# Patient Record
Sex: Female | Born: 1955
Health system: Southern US, Community
[De-identification: ages and names within clinical notes are randomized; demographics above are authoritative.]

## PROBLEM LIST (undated history)

## (undated) DIAGNOSIS — J45909 Unspecified asthma, uncomplicated: Secondary | ICD-10-CM

## (undated) DIAGNOSIS — I2102 ST elevation (STEMI) myocardial infarction involving left anterior descending coronary artery: Secondary | ICD-10-CM

## (undated) DIAGNOSIS — I10 Essential (primary) hypertension: Secondary | ICD-10-CM

## (undated) DIAGNOSIS — E119 Type 2 diabetes mellitus without complications: Secondary | ICD-10-CM

## (undated) DIAGNOSIS — E785 Hyperlipidemia, unspecified: Secondary | ICD-10-CM

## (undated) DIAGNOSIS — I2510 Atherosclerotic heart disease of native coronary artery without angina pectoris: Secondary | ICD-10-CM

## (undated) HISTORY — DX: Atherosclerotic heart disease of native coronary artery without angina pectoris: I25.10

## (undated) HISTORY — PX: TUBAL LIGATION: SHX77

## (undated) HISTORY — DX: ST elevation (STEMI) myocardial infarction involving left anterior descending coronary artery: I21.02

## (undated) HISTORY — DX: Type 2 diabetes mellitus without complications: E11.9

## (undated) HISTORY — DX: Hyperlipidemia, unspecified: E78.5

## (undated) SURGERY — Surgical Case
Anesthesia: *Unknown

---

## 2005-01-06 ENCOUNTER — Ambulatory Visit: Payer: Self-pay | Admitting: Unknown Physician Specialty

## 2005-07-31 ENCOUNTER — Ambulatory Visit: Payer: Self-pay | Admitting: Internal Medicine

## 2005-12-21 ENCOUNTER — Ambulatory Visit: Payer: Self-pay | Admitting: Obstetrics and Gynecology

## 2007-12-30 ENCOUNTER — Ambulatory Visit: Payer: Self-pay | Admitting: Internal Medicine

## 2008-05-19 ENCOUNTER — Ambulatory Visit: Payer: Self-pay | Admitting: Internal Medicine

## 2008-09-08 ENCOUNTER — Ambulatory Visit: Payer: Self-pay | Admitting: Internal Medicine

## 2008-09-29 ENCOUNTER — Ambulatory Visit: Payer: Self-pay | Admitting: Obstetrics and Gynecology

## 2009-02-11 ENCOUNTER — Ambulatory Visit: Payer: Self-pay | Admitting: Family Medicine

## 2009-05-13 ENCOUNTER — Ambulatory Visit: Payer: Self-pay | Admitting: Obstetrics and Gynecology

## 2010-02-14 ENCOUNTER — Ambulatory Visit: Payer: Self-pay | Admitting: Internal Medicine

## 2010-02-24 ENCOUNTER — Ambulatory Visit: Payer: Self-pay | Admitting: Internal Medicine

## 2010-08-19 ENCOUNTER — Ambulatory Visit: Payer: Self-pay

## 2010-08-23 ENCOUNTER — Ambulatory Visit: Payer: Self-pay

## 2011-03-06 ENCOUNTER — Ambulatory Visit: Payer: Self-pay

## 2011-03-08 ENCOUNTER — Ambulatory Visit: Payer: Self-pay | Admitting: Internal Medicine

## 2011-07-10 ENCOUNTER — Ambulatory Visit: Payer: Self-pay

## 2011-12-25 ENCOUNTER — Ambulatory Visit: Payer: Self-pay

## 2011-12-25 LAB — COMPREHENSIVE METABOLIC PANEL
Anion Gap: 7 (ref 7–16)
Bilirubin,Total: 0.4 mg/dL (ref 0.2–1.0)
Chloride: 101 mmol/L (ref 98–107)
Co2: 31 mmol/L (ref 21–32)
Creatinine: 0.92 mg/dL (ref 0.60–1.30)
EGFR (African American): >60
EGFR (Non-African Amer.): >60
Osmolality: 279 (ref 275–301)
Potassium: 3.3 mmol/L — ABNORMAL LOW (ref 3.5–5.1)
SGOT(AST): 25 U/L (ref 15–37)
Sodium: 139 mmol/L (ref 136–145)

## 2011-12-25 LAB — CBC WITH DIFFERENTIAL/PLATELET
Basophil #: 0 10*3/uL (ref 0.0–0.1)
Basophil %: 0.5 %
Eosinophil #: 0.2 10*3/uL (ref 0.0–0.7)
Eosinophil %: 2.3 %
Lymphocyte %: 32.8 %
MCHC: 34 g/dL (ref 32.0–36.0)
Monocyte %: 4.8 %
Neutrophil #: 5.3 10*3/uL (ref 1.4–6.5)
Neutrophil %: 59.6 %
RBC: 4.6 10*6/uL (ref 3.80–5.20)
RDW: 13.2 % (ref 11.5–14.5)

## 2011-12-25 LAB — URINALYSIS, COMPLETE
Bacteria: NEGATIVE
Bilirubin,UR: NEGATIVE
Glucose,UR: NEGATIVE mg/dL (ref 0–75)
Leukocyte Esterase: NEGATIVE
Nitrite: NEGATIVE
Protein: NEGATIVE
Specific Gravity: 1.015 (ref 1.003–1.030)

## 2011-12-26 ENCOUNTER — Ambulatory Visit: Payer: Self-pay | Admitting: Internal Medicine

## 2012-03-15 ENCOUNTER — Ambulatory Visit: Payer: Self-pay

## 2012-03-15 LAB — CBC WITH DIFFERENTIAL/PLATELET
Basophil %: 0.9 %
Eosinophil %: 3 %
HCT: 42.8 % (ref 35.0–47.0)
HGB: 14 g/dL (ref 12.0–16.0)
Lymphocyte #: 3 10*3/uL (ref 1.0–3.6)
MCV: 93 fL (ref 80–100)
Monocyte #: 0.4 x10 3/mm (ref 0.2–0.9)
Monocyte %: 4.5 %
Neutrophil #: 5.1 10*3/uL (ref 1.4–6.5)
RBC: 4.6 10*6/uL (ref 3.80–5.20)
WBC: 8.8 10*3/uL (ref 3.6–11.0)

## 2012-03-15 LAB — LIPID PANEL
Cholesterol: 145 mg/dL (ref 0–200)
HDL Cholesterol: 23 mg/dL — ABNORMAL LOW (ref 40–60)
Ldl Cholesterol, Calc: 80 mg/dL (ref 0–100)
Triglycerides: 209 mg/dL — ABNORMAL HIGH (ref 0–200)
VLDL Cholesterol, Calc: 42 mg/dL — ABNORMAL HIGH (ref 5–40)

## 2012-03-15 LAB — COMPREHENSIVE METABOLIC PANEL
Albumin: 3.8 g/dL (ref 3.4–5.0)
Alkaline Phosphatase: 122 U/L (ref 50–136)
BUN: 14 mg/dL (ref 7–18)
Bilirubin,Total: 0.4 mg/dL (ref 0.2–1.0)
Creatinine: 0.89 mg/dL (ref 0.60–1.30)
Glucose: 96 mg/dL (ref 65–99)
SGPT (ALT): 39 U/L (ref 12–78)
Sodium: 143 mmol/L (ref 136–145)
Total Protein: 7.8 g/dL (ref 6.4–8.2)

## 2012-05-08 ENCOUNTER — Ambulatory Visit: Payer: Self-pay | Admitting: Family Medicine

## 2012-05-08 LAB — CBC WITH DIFFERENTIAL/PLATELET
Basophil %: 1.2 %
HGB: 13.3 g/dL (ref 12.0–16.0)
Lymphocyte #: 2.1 10*3/uL (ref 1.0–3.6)
Lymphocyte %: 25.5 %
MCH: 30.3 pg (ref 26.0–34.0)
MCV: 92 fL (ref 80–100)
Monocyte %: 7.3 %
Neutrophil #: 5.2 10*3/uL (ref 1.4–6.5)
RBC: 4.39 10*6/uL (ref 3.80–5.20)
RDW: 13.5 % (ref 11.5–14.5)

## 2012-05-08 LAB — RAPID INFLUENZA A&B ANTIGENS

## 2012-12-16 ENCOUNTER — Emergency Department: Payer: Self-pay | Admitting: Emergency Medicine

## 2012-12-16 LAB — CBC
HCT: 42.9 % (ref 35.0–47.0)
HGB: 14.7 g/dL (ref 12.0–16.0)
MCH: 31.2 pg (ref 26.0–34.0)
MCV: 91 fL (ref 80–100)
Platelet: 274 10*3/uL (ref 150–440)
RBC: 4.7 10*6/uL (ref 3.80–5.20)
RDW: 13.6 % (ref 11.5–14.5)
WBC: 8 10*3/uL (ref 3.6–11.0)

## 2012-12-16 LAB — BASIC METABOLIC PANEL
BUN: 12 mg/dL (ref 7–18)
Calcium, Total: 9.1 mg/dL (ref 8.5–10.1)
Chloride: 105 mmol/L (ref 98–107)
EGFR (African American): >60
Glucose: 146 mg/dL — ABNORMAL HIGH (ref 65–99)
Osmolality: 280 (ref 275–301)
Sodium: 139 mmol/L (ref 136–145)

## 2012-12-16 LAB — TROPONIN I
Troponin-I: <0.02 ng/mL
Troponin-I: <0.02 ng/mL

## 2013-03-14 ENCOUNTER — Ambulatory Visit: Payer: Self-pay | Admitting: Internal Medicine

## 2013-08-09 ENCOUNTER — Ambulatory Visit: Payer: Self-pay | Admitting: Family Medicine

## 2014-01-28 ENCOUNTER — Ambulatory Visit: Payer: Self-pay

## 2014-02-19 ENCOUNTER — Ambulatory Visit: Payer: Self-pay

## 2014-05-18 ENCOUNTER — Ambulatory Visit: Payer: Self-pay | Admitting: Physician Assistant

## 2014-05-18 LAB — RAPID STREP-A WITH REFLX: MICRO TEXT REPORT: NEGATIVE

## 2014-05-18 LAB — RAPID INFLUENZA A&B ANTIGENS

## 2014-05-20 LAB — BETA STREP CULTURE(ARMC)

## 2014-12-11 ENCOUNTER — Ambulatory Visit: Payer: Managed Care, Other (non HMO)

## 2014-12-11 ENCOUNTER — Ambulatory Visit
Admission: EM | Admit: 2014-12-11 | Discharge: 2014-12-11 | Disposition: A | Discharge status: Home / Self Care | Payer: Managed Care, Other (non HMO) | Attending: Family Medicine | Admitting: Family Medicine

## 2014-12-11 ENCOUNTER — Encounter: Payer: Self-pay | Admitting: Emergency Medicine

## 2014-12-11 DIAGNOSIS — S93402A Sprain of unspecified ligament of left ankle, initial encounter: Secondary | ICD-10-CM | POA: Diagnosis not present

## 2014-12-11 DIAGNOSIS — M7732 Calcaneal spur, left foot: Secondary | ICD-10-CM | POA: Diagnosis not present

## 2014-12-11 DIAGNOSIS — S92302A Fracture of unspecified metatarsal bone(s), left foot, initial encounter for closed fracture: Secondary | ICD-10-CM

## 2014-12-11 HISTORY — DX: Unspecified asthma, uncomplicated: J45.909

## 2014-12-11 HISTORY — DX: Essential (primary) hypertension: I10

## 2014-12-11 MED ORDER — IBUPROFEN 800 MG PO TABS: 800.0000 mg | ORAL_TABLET | Freq: Three times a day (TID) | ORAL | Status: DC | PRN

## 2014-12-11 NOTE — Discharge Instructions (Signed)
Acute Ankle Sprain with Phase I Rehab An acute ankle sprain is a partial or complete tear in one or more of the ligaments of the ankle due to traumatic injury. The severity of the injury depends on both the number of ligaments sprained and the grade of sprain. There are 3 grades of sprains.   A grade 1 sprain is a mild sprain. There is a slight pull without obvious tearing. There is no loss of strength, and the muscle and ligament are the correct length.  A grade 2 sprain is a moderate sprain. There is tearing of fibers within the substance of the ligament where it connects two bones or two cartilages. The length of the ligament is increased, and there is usually decreased strength.  A grade 3 sprain is a complete rupture of the ligament and is uncommon. In addition to the grade of sprain, there are three types of ankle sprains.  Lateral ankle sprains: This is a sprain of one or more of the three ligaments on the outer side (lateral) of the ankle. These are the most common sprains. Medial ankle sprains: There is one large triangular ligament of the inner side (medial) of the ankle that is susceptible to injury. Medial ankle sprains are less common. Syndesmosis, "high ankle," sprains: The syndesmosis is the ligament that connects the two bones of the lower leg. Syndesmosis sprains usually only occur with very severe ankle sprains. SYMPTOMS  Pain, tenderness, and swelling in the ankle, starting at the side of injury that may progress to the whole ankle and foot with time.  "Pop" or tearing sensation at the time of injury.  Bruising that may spread to the heel.  Impaired ability to walk soon after injury. CAUSES   Acute ankle sprains are caused by trauma placed on the ankle that temporarily forces or pries the anklebone (talus) out of its normal socket.  Stretching or tearing of the ligaments that normally hold the joint in place (usually due to a twisting injury). RISK INCREASES  WITH:  Previous ankle sprain.  Sports in which the foot may land awkwardly (i.e., basketball, volleyball, or soccer) or walking or running on uneven or rough surfaces.  Shoes with inadequate support to prevent sideways motion when stress occurs.  Poor strength and flexibility.  Poor balance skills.  Contact sports. PREVENTION   Warm up and stretch properly before activity.  Maintain physical fitness:  Ankle and leg flexibility, muscle strength, and endurance.  Cardiovascular fitness.  Balance training activities.  Use proper technique and have a coach correct improper technique.  Taping, protective strapping, bracing, or high-top tennis shoes may help prevent injury. Initially, tape is best; however, it loses most of its support function within 10 to 15 minutes.  Wear proper-fitted protective shoes (High-top shoes with taping or bracing is more effective than either alone).  Provide the ankle with support during sports and practice activities for 12 months following injury. PROGNOSIS   If treated properly, ankle sprains can be expected to recover completely; however, the length of recovery depends on the degree of injury.  A grade 1 sprain usually heals enough in 5 to 7 days to allow modified activity and requires an average of 6 weeks to heal completely.  A grade 2 sprain requires 6 to 10 weeks to heal completely.  A grade 3 sprain requires 12 to 16 weeks to heal.  A syndesmosis sprain often takes more than 3 months to heal. RELATED COMPLICATIONS   Frequent recurrence of symptoms may  result in a chronic problem. Appropriately addressing the problem the first time decreases the frequency of recurrence and optimizes healing time. Severity of the initial sprain does not predict the likelihood of later instability.  Injury to other structures (bone, cartilage, or tendon).  A chronically unstable or arthritic ankle joint is a possibility with repeated  sprains. TREATMENT Treatment initially involves the use of ice, medication, and compression bandages to help reduce pain and inflammation. Ankle sprains are usually immobilized in a walking cast or boot to allow for healing. Crutches may be recommended to reduce pressure on the injury. After immobilization, strengthening and stretching exercises may be necessary to regain strength and a full range of motion. Surgery is rarely needed to treat ankle sprains. MEDICATION   Nonsteroidal anti-inflammatory medications, such as aspirin and ibuprofen (do not take for the first 3 days after injury or within 7 days before surgery), or other minor pain relievers, such as acetaminophen, are often recommended. Take these as directed by your caregiver. Contact your caregiver immediately if any bleeding, stomach upset, or signs of an allergic reaction occur from these medications.  Ointments applied to the skin may be helpful.  Pain relievers may be prescribed as necessary by your caregiver. Do not take prescription pain medication for longer than 4 to 7 days. Use only as directed and only as much as you need. HEAT AND COLD  Cold treatment (icing) is used to relieve pain and reduce inflammation for acute and chronic cases. Cold should be applied for 10 to 15 minutes every 2 to 3 hours for inflammation and pain and immediately after any activity that aggravates your symptoms. Use ice packs or an ice massage.  Heat treatment may be used before performing stretching and strengthening activities prescribed by your caregiver. Use a heat pack or a warm soak. SEEK IMMEDIATE MEDICAL CARE IF:   Pain, swelling, or bruising worsens despite treatment.  You experience pain, numbness, discoloration, or coldness in the foot or toes.  New, unexplained symptoms develop (drugs used in treatment may produce side effects.) EXERCISES  PHASE I EXERCISES RANGE OF MOTION (ROM) AND STRETCHING EXERCISES - Ankle Sprain, Acute Phase I,  Weeks 1 to 2 These exercises may help you when beginning to restore flexibility in your ankle. You will likely work on these exercises for the 1 to 2 weeks after your injury. Once your physician, physical therapist, or athletic trainer sees adequate progress, he or she will advance your exercises. While completing these exercises, remember:   Restoring tissue flexibility helps normal motion to return to the joints. This allows healthier, less painful movement and activity.  An effective stretch should be held for at least 30 seconds.  A stretch should never be painful. You should only feel a gentle lengthening or release in the stretched tissue. RANGE OF MOTION - Dorsi/Plantar Flexion  While sitting with your right / left knee straight, draw the top of your foot upwards by flexing your ankle. Then reverse the motion, pointing your toes downward.  Hold each position for __________ seconds.  After completing your first set of exercises, repeat this exercise with your knee bent. Repeat __________ times. Complete this exercise __________ times per day.  RANGE OF MOTION - Ankle Alphabet  Imagine your right / left big toe is a pen.  Keeping your hip and knee still, write out the entire alphabet with your "pen." Make the letters as large as you can without increasing any discomfort. Repeat __________ times. Complete this exercise __________  times per day.  STRENGTHENING EXERCISES - Ankle Sprain, Acute -Phase I, Weeks 1 to 2 These exercises may help you when beginning to restore strength in your ankle. You will likely work on these exercises for 1 to 2 weeks after your injury. Once your physician, physical therapist, or athletic trainer sees adequate progress, he or she will advance your exercises. While completing these exercises, remember:   Muscles can gain both the endurance and the strength needed for everyday activities through controlled exercises.  Complete these exercises as instructed by  your physician, physical therapist, or athletic trainer. Progress the resistance and repetitions only as guided.  You may experience muscle soreness or fatigue, but the pain or discomfort you are trying to eliminate should never worsen during these exercises. If this pain does worsen, stop and make certain you are following the directions exactly. If the pain is still present after adjustments, discontinue the exercise until you can discuss the trouble with your clinician. STRENGTH - Dorsiflexors  Secure a rubber exercise band/tubing to a fixed object (i.e., table, pole) and loop the other end around your right / left foot.  Sit on the floor facing the fixed object. The band/tubing should be slightly tense when your foot is relaxed.  Slowly draw your foot back toward you using your ankle and toes.  Hold this position for __________ seconds. Slowly release the tension in the band and return your foot to the starting position. Repeat __________ times. Complete this exercise __________ times per day.  STRENGTH - Plantar-flexors   Sit with your right / left leg extended. Holding onto both ends of a rubber exercise band/tubing, loop it around the ball of your foot. Keep a slight tension in the band.  Slowly push your toes away from you, pointing them downward.  Hold this position for __________ seconds. Return slowly, controlling the tension in the band/tubing. Repeat __________ times. Complete this exercise __________ times per day.  STRENGTH - Ankle Eversion  Secure one end of a rubber exercise band/tubing to a fixed object (table, pole). Loop the other end around your foot just before your toes.  Place your fists between your knees. This will focus your strengthening at your ankle.  Drawing the band/tubing across your opposite foot, slowly, pull your little toe out and up. Make sure the band/tubing is positioned to resist the entire motion.  Hold this position for __________ seconds. Have  your muscles resist the band/tubing as it slowly pulls your foot back to the starting position.  Repeat __________ times. Complete this exercise __________ times per day.  STRENGTH - Ankle Inversion  Secure one end of a rubber exercise band/tubing to a fixed object (table, pole). Loop the other end around your foot just before your toes.  Place your fists between your knees. This will focus your strengthening at your ankle.  Slowly, pull your big toe up and in, making sure the band/tubing is positioned to resist the entire motion.  Hold this position for __________ seconds.  Have your muscles resist the band/tubing as it slowly pulls your foot back to the starting position. Repeat __________ times. Complete this exercises __________ times per day.  STRENGTH - Towel Curls  Sit in a chair positioned on a non-carpeted surface.  Place your right / left foot on a towel, keeping your heel on the floor.  Pull the towel toward your heel by only curling your toes. Keep your heel on the floor.  If instructed by your physician, physical therapist,   or athletic trainer, add weight to the end of the towel. Repeat __________ times. Complete this exercise __________ times per day. Document Released: 11/02/2004 Document Revised: 08/18/2013 Document Reviewed: 07/16/2008 [facility] Patient Information 2015 [employer], [place]. This information is not intended to replace advice given to you by your health care provider. Make sure you discuss any questions you have with your health care provider. Toe Fracture  with Rehab A fracture is a break in the bone that can be either partial or complete. Fractures of the toe bones may or may not include the joints that separate the bones. SYMPTOMS   Severe pain over the fracture site at the time of injury that may persist for an extend period of time.  Pain, tenderness, inflammation, and/or bruising (contusion) over the fracture site.  Visible deformity, if the bone  fragments are not properly aligned (displaced fracture).  Signs of vascular damage: numbness or coldness (uncommon). CAUSES  Toe fractures occur when a force is placed on the bone that is greater than it can withstand.  Direct hit (trauma) to the toe.  Indirect trauma to the toe, such as forcefully pivoting on a planted foot. RISK INCREASES WITH:  Performing activities barefoot (i.e. ballet, gymnastics).  Wearing shoes with little support or protection.  Sports with cleats (i.e. football, rugby, lacrosse, soccer).  Bone disease (i.e. osteoporosis, bone tumors). PREVENTION   Wear properly fitted and protective shoes.  Protect previously injured toes with tape or padding. PROGNOSIS  If treated properly, toe fractures usually heal within 4 to 6 weeks. RELATED COMPLICATIONS   Failure of the fracture to heal (nonunion).  Healing of the fracture in a poor position (malunion).  Recurring symptoms.  Recurring symptoms that result in a chronic problem.  Excessive bleeding, causing pressure on nerves and blood vessels (rare).  Arthritis of the affected joints.  Stopping of bone growth in children.  Infection in fractures where the skin is broken over the fracture (open fracture).  Shortening of injured bones. TREATMENT  Treatment first involves the use of ice and medicine to reduce pain and inflammation. The toe should be restrained for a period of time to allow for healing, usually about 4 weeks. Your caregiver may advise wearing a hard-soled shoe to minimize stress on the healing bone. Surgery is uncommon for this injury, but may be necessary if the fracture is severely displaced or if the bone pushes through the skin. Surgery typically involves the use of screws, pins, and/or plates to hold the fracture in place. After surgery, restraint of the foot is necessary. MEDICATION   If pain medicine is necessary, nonsteroidal anti-inflammatory medications (aspirin and ibuprofen), or  other minor pain relievers (acetaminophen), are often recommended.  Do not take pain medicine for 7 days before surgery.  Prescription pain relievers may be given if your caregiver thinks they are needed. Use only as directed and only as much as you need. COLD THERAPY  Cold treatment (icing) relieves pain and reduces inflammation. Cold treatment should be applied for 10 to 15 minutes every 2 to 3 hours, and immediately after activity that aggravates your symptoms. Use ice packs or an ice massage. SEEK MEDICAL CARE IF:   Treatment does not seem to help, or the condition gets worse.  Any medicines produce negative side effects.  Any complications from surgery occur:  Pain, numbness, or coldness in the affected foot.  Discoloration beneath the toenails (blue or gray) of the affected foot.  Signs of infection (fever, pain, inflammation, redness, or persistent  bleeding). EXERCISES RANGE OF MOTION (ROM) AND STRETCHING EXERCISES - Toe Fracture (Phalangeal) These exercises may help you when beginning to rehabilitate your injury. Your symptoms may resolve with or without further involvement from your physician, physical therapist or athletic trainer. While completing these exercises, remember:   Restoring tissue flexibility helps normal motion to return to the joints. This allows healthier, less painful movement and activity.  An effective stretch should be held for at least 30 seconds.  A stretch should never be painful. You should only feel a gentle lengthening or release in the stretched tissue. RANGE OF MOTION - Dorsi/Plantar Flexion  While sitting with your right / left knee straight, draw the top of your foot upwards by flexing your ankle. Then reverse the motion, pointing your toes downward.  Hold each position for __________ seconds.  After completing your first set of exercises, repeat this exercise with your knee bent. Repeat __________ times. Complete this exercise __________  times per day.  RANGE OF MOTION - Ankle Alphabet Imagine your right / left big toe is a pen. Keeping your hip and knee still, write out the entire alphabet with your "pen." Make the letters as large as you can without increasing any discomfort. Repeat __________ times. Complete this exercise __________ times per day.  RANGE OF MOTION - Toe Extension, Flexion  Sit with your right / left leg crossed over your opposite knee.  Grasp your toes and gently pull them back toward the top of your foot. You should feel a stretch on the bottom of your toes and foot.  Hold this stretch for __________ seconds.  Now, gently pull your toes toward the bottom of your foot. You should feel a stretch on the top of your toes and foot.  Hold this stretch for __________ seconds. Repeat __________ times. Complete this stretch__________ times per day.  STRENGTHENING EXERCISES - Toe Fracture (Phalangeal) These exercises may help you when beginning to rehabilitate your injury. They may resolve your symptoms with or without further involvement from your physician, physical therapist or athletic trainer. While completing these exercises, remember:   Muscles can gain both the endurance and the strength needed for everyday activities through controlled exercises.  Complete these exercises as instructed by your physician, physical therapist or athletic trainer. Increase the resistance and repetitions only as guided.  You may experience muscle soreness or fatigue, but the pain or discomfort you are trying to eliminate should never worsen during these exercises. If this pain does get worse, stop and make sure you are following the directions exactly. If the pain is still present after adjustments, discontinue the exercise until you can discuss the trouble with your clinician. STRENGTH - Towel Curls  Sit in a chair, on a non-carpeted surface.  Place your foot on a towel, keeping your heel on the floor.  Pull the towel  toward your heel only by curling your toes. Keep your heel on the floor.  If instructed by your physician, physical therapist or athletic trainer, add ____________________ at the end of the towel. Repeat __________ times. Complete this exercise __________ times per day. Document Released: 04/03/2005 Document Revised: 06/26/2011 Document Reviewed: 07/16/2008 [facility] Patient Information 2015 [place], [place]. This information is not intended to replace advice given to you by your health care provider. Make sure you discuss any questions you have with your health care provider. Heel Spur A heel spur is a hook of bone that can form on the calcaneus (the heel bone and the largest bone of  the foot). Heel spurs are often associated with plantar fasciitis and usually come in people who have had the problem for an extended period of time. The cause of the relationship is unknown. The pain associated with them is thought to be caused by an inflammation (soreness and redness) of the plantar fascia rather than the spur itself. The plantar fascia is a thick fibrous like tissue that runs from the calcaneus (heel bone) to the ball of the foot. This strong, tight tissue helps maintain the arch of your foot. It helps distribute the weight across your foot as you walk or run. Stresses placed on the plantar fascia can be tremendous. When it is inflamed normal activities become painful. Pain is worse in the morning after sleeping. After sleeping the plantar fascia is tight. The first movements stretch the fascia and this causes pain. As the tendon loosens, the pain usually gets better. It often returns with too much standing or walking.  About 70% of patients with plantar fasciitis have a heel spur. About half of people without foot pain also have heel spurs. DIAGNOSIS  The diagnosis of a heel spur is made by X-ray. The X-ray shows a hook of bone protruding from the bottom of the calcaneus at the point where the plantar  fascia is attached to the heel bone.  TREATMENT  It is necessary to find out what is causing the stretching of the plantar fascia. If the cause is over-pronation (flat feet), orthotics and proper foot ware may help.  Stretching exercises, losing weight, wearing shoes that have a cushioned heel that absorbs shock, and elevating the heel with the use of a heel cradle, heel cup, or orthotics may all help. Heel cradles and heel cups provide extra comfort and cushion to the heel, and reduce the amount of shock to the sore area. AVOIDING THE PAIN OF PLANTAR FASCIITIS AND HEEL SPURS  Consult a sports medicine professional before beginning a new exercise program.  Walking programs offer a good workout. There is a lower chance of overuse injuries common to the runners. There is less impact and less jarring of the joints.  Begin all new exercise programs slowly. If problems or pains develop, decrease the amount of time or distance until you are at a comfortable level.  Wear good shoes and replace them regularly.  Stretch your foot and the heel cords at the back of the ankle (Achilles tendons) both before and after exercise.  Run or exercise on even surfaces that are not hard. For example, asphalt is better than pavement.  Do not run barefoot on hard surfaces.  If using a treadmill, vary the incline.  Do not continue to workout if you have foot or joint problems. Seek professional help if they do not improve. HOME CARE INSTRUCTIONS   Avoid activities that cause you pain until you recover.  Use ice or cold packs to the problem or painful areas after working out.  Only take over-the-counter or prescription medicines for pain, discomfort, or fever as directed by your caregiver.  Soft shoe inserts or athletic shoes with air or gel sole cushions may be helpful.  If problems continue or become more severe, consult a sports medicine caregiver. Cortisone is a potent anti-inflammatory medication that  may be injected into the painful area. You can discuss this treatment with your caregiver. MAKE SURE YOU:   Understand these instructions.  Will watch your condition.  Will get help right away if you are not doing well or get  worse. Document Released: 05/10/2005 Document Revised: 06/26/2011 Document Reviewed: 06/04/2013 [facility] Patient Information 2015 [place], [place]. This information is not intended to replace advice given to you by your health care provider. Make sure you discuss any questions you have with your health care provider.

## 2014-12-11 NOTE — ED Provider Notes (Signed)
CSN: [id number]     Arrival date & time 12/11/14  1030 History   First MD Initiated Contact with Patient 12/11/14 1107     Chief Complaint  Patient presents with  . Foot Injury   (Consider location/radiation/quality/duration/timing/severity/associated sxs/prior Treatment) HPI Comments: Married caucasian female here for evaluation of left foot and ankle pain.  Left ankle rolled (Everted) in [place] last night stepping off stairs thinks she stepped on rock.  Has been sore, swollen and motrin not helping.  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  Cannot tolerate any closed shoes on left foot today wearing flip flops.  Pain with ambulation.  Took motrin 600mg  po this am helps a little.  Iced last night and this am.  Not scheduled to work today doesn't need work excuse.  The history is provided by the patient.    Past Medical History  Diagnosis Date  . Asthma   . Hypertension    Past Surgical History  Procedure Laterality Date  . Tubal ligation     History reviewed. No pertinent family history. Social History  Substance Use Topics  . Smoking status: Never Smoker   . Smokeless tobacco: None  . Alcohol Use: No   OB History    No data available     Review of Systems  Constitutional: Negative for fever, chills, diaphoresis, activity change, appetite change and fatigue.  HENT: Negative for congestion, dental problem, drooling, ear discharge, ear pain, facial swelling, hearing loss, mouth sores and nosebleeds.   Eyes: Negative for photophobia, pain, discharge, redness and itching.  Respiratory: Negative for cough, shortness of breath, wheezing and stridor.   Cardiovascular: Negative for chest pain, palpitations and leg swelling.  Gastrointestinal: Negative for nausea, vomiting, abdominal pain, diarrhea, constipation, blood in stool and abdominal distention.  Endocrine: Negative for cold intolerance and heat intolerance.    Genitourinary: Negative for dysuria and difficulty urinating.  Musculoskeletal: Positive for myalgias, joint swelling and gait problem. Negative for back pain, neck pain and neck stiffness.  Skin: Negative for color change, pallor, rash and wound.  Allergic/Immunologic: Positive for environmental allergies. Negative for food allergies.  Neurological: Negative for dizziness, tremors, seizures, facial asymmetry, weakness, light-headedness, numbness and headaches.  Hematological: Negative for adenopathy. Does not bruise/bleed easily.  Psychiatric/Behavioral: Negative for behavioral problems, confusion, sleep disturbance and agitation.    Allergies  Review of patient's allergies indicates no known allergies.  Home Medications   Prior to Admission medications   Medication Sig Start Date End Date Taking? Authorizing Provider  fluticasone (FLONASE) 50 MCG/ACT nasal spray Place 1 spray into both nostrils daily.   Yes Historical Provider, MD  lisinopril (PRINIVIL,ZESTRIL) 20 MG tablet Take 20 mg by mouth daily.   Yes Historical Provider, MD  ibuprofen (ADVIL,MOTRIN) 800 MG tablet Take 1 tablet (800 mg total) by mouth every 8 (eight) hours as needed. 12/11/14   Olen Cordial, NP   Meds Ordered and Administered this Visit  Medications - No data to display  BP 127/70 mmHg  Pulse 79  Temp(Src) 98.5 F (36.9 C) (Tympanic)  Resp 18  Ht 5\' 3"  (1.6 m)  Wt 175 lb (79.379 kg)  BMI 31.01 kg/m2  SpO2 99%  LMP  No data found.   Physical Exam  Constitutional: She is oriented to person, place, and time. Vital signs are normal. She appears well-developed and well-nourished. No distress.  HENT:  Head: Normocephalic and atraumatic.  Right Ear: External ear normal.  Left Ear: External ear normal.  Nose: Nose normal.  Mouth/Throat: Oropharynx is clear and moist. No oropharyngeal exudate.  Eyes: Conjunctivae, EOM and lids are normal. Pupils are equal, round, and reactive to light. Right eye  exhibits no discharge. Left eye exhibits no discharge. No scleral icterus.  Neck: Trachea normal and normal range of motion. Neck supple. No tracheal deviation present.  Cardiovascular: Normal rate, regular rhythm and intact distal pulses.   Pulmonary/Chest: Effort normal and breath sounds normal. No stridor. No respiratory distress. She has no wheezes. She has no rales.  Abdominal: Soft. She exhibits no distension.  Musculoskeletal: She exhibits edema and tenderness.       Right hip: Normal.       Left hip: Normal.       Right knee: Normal.       Left knee: Normal.       Right ankle: Normal.       Left ankle: She exhibits decreased range of motion and swelling. She exhibits no ecchymosis, no deformity, no laceration and normal pulse. Tenderness. Lateral malleolus and head of 5th metatarsal tenderness found. No medial malleolus and no proximal fibula tenderness found. Achilles tendon normal. Achilles tendon exhibits no pain and no defect.       Right upper leg: Normal.       Left upper leg: Normal.       Right lower leg: Normal.       Left lower leg: Normal.       Legs:      Right foot: Normal.       Left foot: There is tenderness, bony tenderness and swelling. There is normal range of motion, normal capillary refill, no crepitus, no deformity and no laceration.  Neurological: She is alert and oriented to person, place, and time. She exhibits normal muscle tone. Coordination normal.  Skin: Skin is warm, dry and intact. No abrasion, no bruising, no burn, no ecchymosis, no laceration, no lesion, no petechiae, no purpura and no rash noted. Rash is not macular, not papular, not maculopapular, not nodular, not pustular, not vesicular and not urticarial. She is not diaphoretic. No cyanosis or erythema. No pallor. Nails show no clubbing.  Psychiatric: She has a normal mood and affect. Her speech is normal and behavior is normal. Judgment and thought content normal. Cognition and memory are normal.    Nursing note and vitals reviewed.   ED Course  Procedures (including critical care time)  Labs Review Labs Reviewed - No data to display  Imaging Review Dg Ankle Complete Left  12/11/2014   CLINICAL DATA:  Acute left foot pain following twisting injury last night. Initial encounter.  EXAM: LEFT ANKLE COMPLETE - 3+ VIEW  COMPARISON:  None.  FINDINGS: A nondisplaced transverse fracture at the base of the fifth metatarsal is noted.  No other fracture, subluxation or dislocation identified.  The ankle joint is unremarkable.  No focal bony lesions are present.  IMPRESSION: Nondisplaced transverse fracture of the fifth metatarsal base.   Electronically Signed   By: Margarette Canada M.D.   On: 12/11/2014 12:26   Dg Foot Complete Left  12/11/2014   CLINICAL DATA:  Twisting injury last night. Lateral pain. Initial encounter.  EXAM: LEFT FOOT - COMPLETE 3+ VIEW  COMPARISON:  Ankle films, dictated separately.  FINDINGS: Tiny calcaneal spur. Nondisplaced fracture the base of fifth metatarsal. Mild midfoot soft tissue swelling laterally.  IMPRESSION: Fracture at the base of the fifth metatarsal.   Electronically Signed  By: Abigail Miyamoto M.D.   On: 12/11/2014 12:26   1250 patient notified of xray results and given copy of radiology report and received disk with images.  Patient prefers to see Dr Jefm Bryant and will call to schedule appt with him.  Patient verbalized understanding of information/instructions, agreed with plan of care and had no further questions at this time.  MDM   1. Fracture of 5th metatarsal, left, closed, initial encounter   2. Calcaneal spur of foot, left   3. Ankle sprain, left, initial encounter    Plan: 1. Test/x-ray results and diagnosis reviewed with patient 2. rx as per orders; risks, benefits, potential side effects reviewed with patient 3. Recommend supportive treatment with cam walker, crutches, motrin, ice and elevation 4. F/u prn if symptoms worsen or don't  improve  Patient was instructed to rest, ice and elevate the ankle as much as possible.  Activity as tolerated and work on ROM exercises per [employer] handout ankle sprain with phase I rehab.  Patient is to take NSAIDS as needed motrin 800mg  po TID.  Discussed at risk to reinjure ankle over the next year and to wear supportive footwear/ankle sleeve/ace bandage.  Follow up with PCM if symptoms persist greater than 4 weeks for re-evaluation as ankle sprains typically heal over 4 weeks.  Work restriction note given to patient for crutches, ice, elevation when sitting.  Patient was placed in cam walker for 5th metatarsal fracture avoid weightbearing/impact to left foot/use crutches.  C[place]oot and crutches fitted and distributed by [employer].  Exitcare handout on toe fractures given to patient.  Ensure getting adequate calcium and vitamin D in diet.  Patient verbalized agreement and understanding of treatment plan and had no further questions at this time.   P2:  Injury Prevention and Fitness.  New calcaneal spur on xray discussed if heel pain starts to ice heel/stretches, avoid flip flops wear supportive shoes, avoid impact activities and if walking treadmill/grass/soft path best.  Use cushioned mat if having to stand in one place for long durations.  Patient given [facility] handout on heel spur/plantar fasciitis.  Discussed stretches and icing.  Follow up with PCM if no improvement with discussed care for podiatry referral.  May use motrin 800mg  po TID if pain.  Consider new supportive footwear/OTC inserts if shoe treads worn out/greater than 67 year old.  Patient verbalized understanding of instructions, agreed with plan of care and had no further questions at this time.     Olen Cordial, NP 12/11/14 1346

## 2014-12-11 NOTE — ED Notes (Signed)
Pt with left foot injury x last pm pain and swelling in left foot ice applied

## 2014-12-12 ENCOUNTER — Ambulatory Visit
Admission: EM | Admit: 2014-12-12 | Discharge: 2014-12-12 | Disposition: A | Discharge status: Home / Self Care | Payer: Managed Care, Other (non HMO) | Attending: Family Medicine | Admitting: Family Medicine

## 2014-12-12 ENCOUNTER — Encounter: Payer: Self-pay | Admitting: *Deleted

## 2014-12-12 DIAGNOSIS — R600 Localized edema: Secondary | ICD-10-CM | POA: Diagnosis not present

## 2014-12-12 DIAGNOSIS — S92302S Fracture of unspecified metatarsal bone(s), left foot, sequela: Secondary | ICD-10-CM | POA: Diagnosis not present

## 2014-12-12 NOTE — ED Provider Notes (Signed)
CSN: [id number]     Arrival date & time 12/12/14  1158 History   First MD Initiated Contact with Patient 12/12/14 1329     Chief Complaint  Patient presents with  . Leg Swelling   (Consider location/radiation/quality/duration/timing/severity/associated sxs/prior Treatment) HPI Comments: 59 yo female seen yesterday with a foot/ankle injury and diagnosed with a metatarsal fracture of the base of the 5th metatarsal. Here with concern for left shin swelling. Denies any pain or redness. Patient was placed on CAM boot for immobilization and states she kept her leg elevated yesterday but today woke up with more swelling above the ankle joint. Denies pain to this area. No numbness or tingling.  No recent history of prolonged immobilization or surgery.  The history is provided by the patient.    Past Medical History  Diagnosis Date  . Asthma   . Hypertension    Past Surgical History  Procedure Laterality Date  . Tubal ligation     History reviewed. No pertinent family history. Social History  Substance Use Topics  . Smoking status: Never Smoker   . Smokeless tobacco: Never Used  . Alcohol Use: None   OB History    No data available     Review of Systems  Allergies  Review of patient's allergies indicates no known allergies.  Home Medications   Prior to Admission medications   Medication Sig Start Date End Date Taking? Authorizing Provider  fluticasone (FLONASE) 50 MCG/ACT nasal spray Place 1 spray into both nostrils daily.    Historical Provider, MD  ibuprofen (ADVIL,MOTRIN) 800 MG tablet Take 1 tablet (800 mg total) by mouth every 8 (eight) hours as needed. 12/11/14   Olen Cordial, NP  lisinopril (PRINIVIL,ZESTRIL) 20 MG tablet Take 20 mg by mouth daily.    Historical Provider, MD   Meds Ordered and Administered this Visit  Medications - No data to display  BP 135/72 mmHg  Pulse 72  Temp(Src) 98.5 F (36.9 C) (Oral)  Resp 18  Ht 5\' 2"  (1.575 m)  Wt 175 lb (79.379  kg)  BMI 32.00 kg/m2  SpO2 97% No data found.   Physical Exam  Constitutional: She appears well-developed and well-nourished. No distress.  Musculoskeletal: She exhibits edema.  Left lower extremity neurovascularly intact; tenderness to palpation at base of 5th metatarsal; no calf tenderness or erythema; mild edema noted to the anterior/lateral lower shin; skin intact  Neurological: She is alert.  Skin: She is not diaphoretic.  Nursing note and vitals reviewed.   ED Course  Procedures (including critical care time)  Labs Review Labs Reviewed - No data to display  Imaging Review Dg Ankle Complete Left  12/11/2014   CLINICAL DATA:  Acute left foot pain following twisting injury last night. Initial encounter.  EXAM: LEFT ANKLE COMPLETE - 3+ VIEW  COMPARISON:  None.  FINDINGS: A nondisplaced transverse fracture at the base of the fifth metatarsal is noted.  No other fracture, subluxation or dislocation identified.  The ankle joint is unremarkable.  No focal bony lesions are present.  IMPRESSION: Nondisplaced transverse fracture of the fifth metatarsal base.   Electronically Signed   By: Margarette Canada M.D.   On: 12/11/2014 12:26   Dg Foot Complete Left  12/11/2014   CLINICAL DATA:  Twisting injury last night. Lateral pain. Initial encounter.  EXAM: LEFT FOOT - COMPLETE 3+ VIEW  COMPARISON:  Ankle films, dictated separately.  FINDINGS: Tiny calcaneal spur. Nondisplaced fracture the base of fifth metatarsal. Mild midfoot soft tissue  swelling laterally.  IMPRESSION: Fracture at the base of the fifth metatarsal.   Electronically Signed   By: Abigail Miyamoto M.D.   On: 12/11/2014 12:26     Visual Acuity Review  Right Eye Distance:   Left Eye Distance:   Bilateral Distance:    Right Eye Near:   Left Eye Near:    Bilateral Near:         MDM   1. Edema of left lower extremity   2. Fracture of 5th metatarsal, left, sequela    Plan: 1. diagnosis reviewed with patient; edema secondary to  recent injury and elevation of extremity with fluids redistribution 2. Continue current management as per orders yesterday 3. F/u with ortho on Monday as scheduled or here sooner prn  Norval Gable, MD 12/12/14 1402

## 2014-12-12 NOTE — ED Notes (Signed)
Recheck of left ankle and leg after seen here yesterday for fracture yesterday.  Swelling of left leg has increased, not hot to touch, no red streaks, no increased pain.  Still unable to bear weight on heel.  Pulses 2t.

## 2015-02-10 ENCOUNTER — Other Ambulatory Visit: Payer: Self-pay | Admitting: Nurse Practitioner

## 2015-02-10 DIAGNOSIS — E2839 Other primary ovarian failure: Secondary | ICD-10-CM

## 2015-02-10 DIAGNOSIS — Z1231 Encounter for screening mammogram for malignant neoplasm of breast: Secondary | ICD-10-CM

## 2015-03-02 ENCOUNTER — Ambulatory Visit: Payer: Managed Care, Other (non HMO) | Attending: Nurse Practitioner

## 2015-04-19 ENCOUNTER — Ambulatory Visit
Admission: EM | Admit: 2015-04-19 | Discharge: 2015-04-19 | Disposition: A | Discharge status: Home / Self Care | Payer: Managed Care, Other (non HMO) | Attending: Family Medicine | Admitting: Family Medicine

## 2015-04-19 ENCOUNTER — Encounter: Payer: Self-pay | Admitting: Emergency Medicine

## 2015-04-19 DIAGNOSIS — J320 Chronic maxillary sinusitis: Secondary | ICD-10-CM

## 2015-04-19 MED ORDER — CEFUROXIME AXETIL 250 MG PO TABS: ORAL_TABLET | ORAL | Status: DC

## 2015-04-19 NOTE — Discharge Instructions (Signed)

## 2015-04-19 NOTE — ED Provider Notes (Signed)
CSN: [id number]     Arrival date & time 04/19/15  1752 History   First MD Initiated Contact with Patient 04/19/15 1940     Chief Complaint  Patient presents with  . Nasal Congestion  . Facial Pain   (Consider location/radiation/quality/duration/timing/severity/associated sxs/prior Treatment) HPI   Is a 60 year old female who presents with a fever headache congestion with a yellow thick is from her nose. His had sinus pain and headache for a couple of days now. Any fever or chills. She has an extensive past history of chronic sinusitis even placing an ENT was not a candidate for sinus surgery. 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  Past Medical History  Diagnosis Date  . Asthma   . Hypertension    Past Surgical History  Procedure Laterality Date  . Tubal ligation     History reviewed. No pertinent family history. Social History  Substance Use Topics  . Smoking status: Never Smoker   . Smokeless tobacco: Never Used  . Alcohol Use: None   OB History    No data available     Review of Systems  Constitutional: Negative for fever, chills, diaphoresis and fatigue.  HENT: Positive for congestion, postnasal drip, rhinorrhea, sinus pressure and sneezing.   Respiratory: Positive for cough. Negative for shortness of breath.   All other systems reviewed and are negative.   Allergies  Review of patient's allergies indicates no known allergies.  Home Medications   Prior to Admission medications   Medication Sig Start Date End Date Taking? Authorizing Provider  fexofenadine-pseudoephedrine (ALLEGRA-D 24) 180-240 MG 24 hr tablet Take 1 tablet by mouth daily.   Yes Historical Provider, MD  cefUROXime (CEFTIN) 250 MG tablet Take one tablet BID with food 04/19/15   Lorin Picket, PA-C  fluticasone (FLONASE) 50 MCG/ACT nasal spray Place 1 spray into both nostrils daily.    Historical Provider, MD  ibuprofen (ADVIL,MOTRIN) 800 MG tablet  Take 1 tablet (800 mg total) by mouth every 8 (eight) hours as needed. 12/11/14   Olen Cordial, NP  lisinopril (PRINIVIL,ZESTRIL) 20 MG tablet Take 20 mg by mouth daily.    Historical Provider, MD   Meds Ordered and Administered this Visit  Medications - No data to display  BP 138/91 mmHg  Pulse 82  Temp(Src) 98.7 F (37.1 C) (Tympanic)  Resp 16  Ht 5\' 2"  (1.575 m)  Wt 175 lb (79.379 kg)  BMI 32.00 kg/m2  SpO2 98% No data found.   Physical Exam  Constitutional: She is oriented to person, place, and time. She appears well-developed and well-nourished. No distress.  HENT:  Head: Normocephalic and atraumatic.  Right Ear: External ear normal.  Left Ear: External ear normal.  Mouth/Throat: Oropharynx is clear and moist. No oropharyngeal exudate.  There is tenderness to percussion of the right maxillary sinus.  Eyes: Conjunctivae are normal. Pupils are equal, round, and reactive to light.  Neck: Normal range of motion. Neck supple.  Pulmonary/Chest: Breath sounds normal. No stridor. No respiratory distress. She has no wheezes. She has no rales.  Musculoskeletal: Normal range of motion. She exhibits no edema or tenderness.  Lymphadenopathy:    She has no cervical adenopathy.  Neurological: She is alert and oriented to person, place, and time.  Skin: Skin is warm and dry. She is not diaphoretic.  Psychiatric: She has a normal mood and affect. Her behavior is normal. Judgment and thought content normal.  Nursing note and  vitals reviewed.   ED Course  Procedures (including critical care time)  Labs Review Labs Reviewed - No data to display  Imaging Review No results found.   Visual Acuity Review  Right Eye Distance:   Left Eye Distance:   Bilateral Distance:    Right Eye Near:   Left Eye Near:    Bilateral Near:         MDM   1. Sinusitis, maxillary, chronic    Discharge Medication List as of 04/19/2015  8:01 PM    START taking these medications   Details   cefUROXime (CEFTIN) 250 MG tablet Take one tablet BID with food, Print      Plan: 1. Test/x-ray results and diagnosis reviewed with patient 2. rx as per orders; risks, benefits, potential side effects reviewed with patient 3. Recommend supportive treatment with fluids and rest. I recommended she consider the use of a Nettie pot prior to using her Flonase which she takes chronically. Follow-up with her primary care if she is not improving. Sided put her on antibiotics early because of her chronic sinusitis in the past and her findings today. 4. F/u prn if symptoms worsen or don't improve      Lorin Picket, PA-C 04/19/15 2008

## 2015-04-19 NOTE — ED Notes (Signed)
Patient c/o nasal congestion, head congestion, sinus pain and HAs for couple of days.  Patient denies fevers.

## 2015-05-03 ENCOUNTER — Ambulatory Visit
Admission: RE | Admit: 2015-05-03 | Discharge: 2015-05-03 | Disposition: A | Discharge status: Home / Self Care | Payer: Managed Care, Other (non HMO) | Source: Ambulatory Visit | Attending: Nurse Practitioner | Admitting: Nurse Practitioner

## 2015-05-03 DIAGNOSIS — Z13820 Encounter for screening for osteoporosis: Secondary | ICD-10-CM | POA: Insufficient documentation

## 2015-05-03 DIAGNOSIS — E2839 Other primary ovarian failure: Secondary | ICD-10-CM

## 2015-05-03 DIAGNOSIS — Z1231 Encounter for screening mammogram for malignant neoplasm of breast: Secondary | ICD-10-CM | POA: Diagnosis not present

## 2015-05-03 DIAGNOSIS — Z780 Asymptomatic menopausal state: Secondary | ICD-10-CM | POA: Diagnosis present

## 2015-05-03 DIAGNOSIS — M8580 Other specified disorders of bone density and structure, unspecified site: Secondary | ICD-10-CM | POA: Insufficient documentation

## 2015-05-05 ENCOUNTER — Other Ambulatory Visit: Payer: Self-pay | Admitting: Nurse Practitioner

## 2015-05-05 DIAGNOSIS — N6459 Other signs and symptoms in breast: Secondary | ICD-10-CM

## 2015-05-10 ENCOUNTER — Ambulatory Visit
Admission: RE | Admit: 2015-05-10 | Discharge: 2015-05-10 | Disposition: A | Discharge status: Home / Self Care | Payer: Managed Care, Other (non HMO) | Source: Ambulatory Visit | Attending: Nurse Practitioner | Admitting: Nurse Practitioner

## 2015-05-10 DIAGNOSIS — N6489 Other specified disorders of breast: Secondary | ICD-10-CM | POA: Diagnosis present

## 2015-05-10 DIAGNOSIS — N6001 Solitary cyst of right breast: Secondary | ICD-10-CM | POA: Insufficient documentation

## 2015-05-10 DIAGNOSIS — N6459 Other signs and symptoms in breast: Secondary | ICD-10-CM

## 2015-05-28 ENCOUNTER — Encounter (HOSPITAL_COMMUNITY)
Admission: EM | Disposition: A | Discharge status: Home / Self Care | Payer: Self-pay | Source: Ambulatory Visit | Attending: Cardiology

## 2015-05-28 ENCOUNTER — Inpatient Hospital Stay (HOSPITAL_COMMUNITY)
Admission: EM | Admit: 2015-05-28 | Discharge: 2015-05-31 | DRG: 247 | Disposition: A | Discharge status: Home / Self Care | Payer: Managed Care, Other (non HMO) | Source: Ambulatory Visit | Attending: Cardiology | Admitting: Cardiology

## 2015-05-28 ENCOUNTER — Inpatient Hospital Stay (HOSPITAL_COMMUNITY): Admit: 2015-05-28 | Payer: Self-pay

## 2015-05-28 DIAGNOSIS — Y831 Surgical operation with implant of artificial internal device as the cause of abnormal reaction of the patient, or of later complication, without mention of misadventure at the time of the procedure: Secondary | ICD-10-CM | POA: Diagnosis not present

## 2015-05-28 DIAGNOSIS — T82867A Thrombosis of cardiac prosthetic devices, implants and grafts, initial encounter: Secondary | ICD-10-CM | POA: Diagnosis not present

## 2015-05-28 DIAGNOSIS — I2102 ST elevation (STEMI) myocardial infarction involving left anterior descending coronary artery: Secondary | ICD-10-CM

## 2015-05-28 DIAGNOSIS — I2510 Atherosclerotic heart disease of native coronary artery without angina pectoris: Secondary | ICD-10-CM

## 2015-05-28 DIAGNOSIS — Z8249 Family history of ischemic heart disease and other diseases of the circulatory system: Secondary | ICD-10-CM

## 2015-05-28 DIAGNOSIS — R079 Chest pain, unspecified: Secondary | ICD-10-CM | POA: Diagnosis present

## 2015-05-28 DIAGNOSIS — E785 Hyperlipidemia, unspecified: Secondary | ICD-10-CM | POA: Diagnosis present

## 2015-05-28 DIAGNOSIS — R7303 Prediabetes: Secondary | ICD-10-CM | POA: Diagnosis present

## 2015-05-28 DIAGNOSIS — E669 Obesity, unspecified: Secondary | ICD-10-CM | POA: Diagnosis present

## 2015-05-28 DIAGNOSIS — J45909 Unspecified asthma, uncomplicated: Secondary | ICD-10-CM | POA: Diagnosis present

## 2015-05-28 DIAGNOSIS — I10 Essential (primary) hypertension: Secondary | ICD-10-CM | POA: Diagnosis present

## 2015-05-28 DIAGNOSIS — Z955 Presence of coronary angioplasty implant and graft: Secondary | ICD-10-CM

## 2015-05-28 HISTORY — PX: CARDIAC CATHETERIZATION: SHX172

## 2015-05-28 HISTORY — DX: ST elevation (STEMI) myocardial infarction involving left anterior descending coronary artery: I21.02

## 2015-05-28 LAB — COMPREHENSIVE METABOLIC PANEL
ALK PHOS: 121 U/L (ref 38–126)
ALT: 31 U/L (ref 14–54)
AST: 25 U/L (ref 15–41)
Albumin: 3.4 g/dL — ABNORMAL LOW (ref 3.5–5.0)
Anion gap: 11 (ref 5–15)
BUN: 13 mg/dL (ref 6–20)
CALCIUM: 8.5 mg/dL — AB (ref 8.9–10.3)
CHLORIDE: 103 mmol/L (ref 101–111)
CO2: 25 mmol/L (ref 22–32)
CREATININE: 0.73 mg/dL (ref 0.44–1.00)
GFR calc Af Amer: >60 mL/min (ref >60)
Glucose, Bld: 200 mg/dL — ABNORMAL HIGH (ref 65–99)
Potassium: 3 mmol/L — ABNORMAL LOW (ref 3.5–5.1)
Sodium: 139 mmol/L (ref 135–145)
TOTAL PROTEIN: 6.7 g/dL (ref 6.5–8.1)
Total Bilirubin: 0.3 mg/dL (ref 0.3–1.2)

## 2015-05-28 LAB — POCT I-STAT, CHEM 8
BUN: 15 mg/dL (ref 6–20)
CREATININE: 0.6 mg/dL (ref 0.44–1.00)
Calcium, Ion: 1.15 mmol/L (ref 1.12–1.23)
Chloride: 102 mmol/L (ref 101–111)
GLUCOSE: 203 mg/dL — AB (ref 65–99)
HEMATOCRIT: 42 % (ref 36.0–46.0)
HEMOGLOBIN: 14.3 g/dL (ref 12.0–15.0)
POTASSIUM: 3 mmol/L — AB (ref 3.5–5.1)
Sodium: 141 mmol/L (ref 135–145)
TCO2: 26 mmol/L (ref 0–100)

## 2015-05-28 LAB — APTT: APTT: 38 s — AB (ref 24–37)

## 2015-05-28 LAB — CBC
HEMATOCRIT: 39.3 % (ref 36.0–46.0)
Hemoglobin: 13 g/dL (ref 12.0–15.0)
MCH: 30.1 pg (ref 26.0–34.0)
MCHC: 33.1 g/dL (ref 30.0–36.0)
MCV: 91 fL (ref 78.0–100.0)
PLATELETS: 284 10*3/uL (ref 150–400)
RBC: 4.32 MIL/uL (ref 3.87–5.11)
RDW: 12.8 % (ref 11.5–15.5)
WBC: 13.8 10*3/uL — ABNORMAL HIGH (ref 4.0–10.5)

## 2015-05-28 LAB — POCT ACTIVATED CLOTTING TIME
ACTIVATED CLOTTING TIME: 286 s
Activated Clotting Time: 528 seconds
Activated Clotting Time: 327 seconds

## 2015-05-28 LAB — CK TOTAL AND CKMB (NOT AT ARMC)
CK, MB: 4.3 ng/mL (ref 0.5–5.0)
Relative Index: 3.2 — ABNORMAL HIGH (ref 0.0–2.5)
Total CK: 136 U/L (ref 38–234)

## 2015-05-28 LAB — PROTIME-INR
INR: 1.52 — AB (ref 0.00–1.49)
PROTHROMBIN TIME: 18.3 s — AB (ref 11.6–15.2)

## 2015-05-28 LAB — TROPONIN I: Troponin I: 0.03 ng/mL (ref <0.031)

## 2015-05-28 LAB — LIPID PANEL
CHOLESTEROL: 176 mg/dL (ref 0–200)
HDL: 26 mg/dL — ABNORMAL LOW (ref >40)
LDL Cholesterol: 109 mg/dL — ABNORMAL HIGH (ref 0–99)
Total CHOL/HDL Ratio: 6.8 RATIO
Triglycerides: 204 mg/dL — ABNORMAL HIGH (ref <150)
VLDL: 41 mg/dL — AB (ref 0–40)

## 2015-05-28 LAB — MRSA PCR SCREENING: MRSA by PCR: NEGATIVE

## 2015-05-28 SURGERY — CORONARY STENT INTERVENTION

## 2015-05-28 SURGERY — LEFT HEART CATH AND CORONARY ANGIOGRAPHY
Anesthesia: LOCAL

## 2015-05-28 MED ORDER — FENTANYL CITRATE (PF) 100 MCG/2ML IJ SOLN
INTRAMUSCULAR | Status: DC | PRN
  Administered 2015-05-28: 50 ug via INTRAVENOUS

## 2015-05-28 MED ORDER — NITROGLYCERIN 1 MG/10 ML FOR IR/CATH LAB
INTRA_ARTERIAL | Status: AC
  Filled 2015-05-28: qty 10

## 2015-05-28 MED ORDER — MIDAZOLAM HCL 2 MG/2ML IJ SOLN
INTRAMUSCULAR | Status: DC | PRN
  Administered 2015-05-28: 1 mg via INTRAVENOUS

## 2015-05-28 MED ORDER — NITROGLYCERIN IN D5W 200-5 MCG/ML-% IV SOLN: 0.0000 ug/min | INTRAVENOUS | Status: DC

## 2015-05-28 MED ORDER — SODIUM CHLORIDE 0.9% FLUSH
3.0000 mL | Freq: Two times a day (BID) | INTRAVENOUS | Status: DC
  Administered 2015-05-29 – 2015-05-30 (×5): 3 mL via INTRAVENOUS

## 2015-05-28 MED ORDER — ASPIRIN 81 MG PO CHEW
81.0000 mg | CHEWABLE_TABLET | Freq: Every day | ORAL | Status: DC
  Administered 2015-05-29 – 2015-05-31 (×3): 81 mg via ORAL
  Filled 2015-05-28 (×3): qty 1

## 2015-05-28 MED ORDER — SODIUM CHLORIDE 0.9 % WEIGHT BASED INFUSION
1.0000 mL/kg/h | INTRAVENOUS | Status: AC
  Administered 2015-05-28: 1 mL/kg/h via INTRAVENOUS

## 2015-05-28 MED ORDER — TIROFIBAN HCL IN NACL 5-0.9 MG/100ML-% IV SOLN
0.1500 ug/kg/min | INTRAVENOUS | Status: DC
  Administered 2015-05-28 – 2015-05-29 (×2): 0.15 ug/kg/min via INTRAVENOUS
  Filled 2015-05-28 (×2): qty 100

## 2015-05-28 MED ORDER — ENOXAPARIN SODIUM 40 MG/0.4ML ~~LOC~~ SOLN
40.0000 mg | SUBCUTANEOUS | Status: DC
Start: 2015-05-29 — End: 2015-05-28

## 2015-05-28 MED ORDER — SODIUM CHLORIDE 0.9% FLUSH: 3.0000 mL | INTRAVENOUS | Status: DC | PRN

## 2015-05-28 MED ORDER — HEPARIN SODIUM (PORCINE) 1000 UNIT/ML IJ SOLN
INTRAMUSCULAR | Status: AC
  Filled 2015-05-28: qty 1

## 2015-05-28 MED ORDER — ONDANSETRON HCL 4 MG/2ML IJ SOLN
4.0000 mg | Freq: Four times a day (QID) | INTRAMUSCULAR | Status: DC | PRN
  Administered 2015-05-28: 4 mg via INTRAVENOUS

## 2015-05-28 MED ORDER — NITROGLYCERIN 1 MG/10 ML FOR IR/CATH LAB
INTRA_ARTERIAL | Status: DC | PRN
  Administered 2015-05-28: 200 ug via INTRACORONARY

## 2015-05-28 MED ORDER — VERAPAMIL HCL 2.5 MG/ML IV SOLN
INTRAVENOUS | Status: AC
  Filled 2015-05-28: qty 2

## 2015-05-28 MED ORDER — TICAGRELOR 90 MG PO TABS
ORAL_TABLET | ORAL | Status: AC
  Filled 2015-05-28: qty 2

## 2015-05-28 MED ORDER — ACETAMINOPHEN 325 MG PO TABS
650.0000 mg | ORAL_TABLET | ORAL | Status: DC | PRN
  Administered 2015-05-29 – 2015-05-30 (×2): 650 mg via ORAL
  Filled 2015-05-28 (×2): qty 2

## 2015-05-28 MED ORDER — HEPARIN (PORCINE) IN NACL 2-0.9 UNIT/ML-% IJ SOLN
INTRAMUSCULAR | Status: AC
  Filled 2015-05-28: qty 1500

## 2015-05-28 MED ORDER — MORPHINE SULFATE (PF) 2 MG/ML IV SOLN
2.0000 mg | INTRAVENOUS | Status: DC | PRN
  Administered 2015-05-28: 2 mg via INTRAVENOUS

## 2015-05-28 MED ORDER — HEPARIN (PORCINE) IN NACL 2-0.9 UNIT/ML-% IJ SOLN
INTRAMUSCULAR | Status: DC | PRN
  Administered 2015-05-28: 1500 mL

## 2015-05-28 MED ORDER — CARVEDILOL 3.125 MG PO TABS
3.1250 mg | ORAL_TABLET | Freq: Two times a day (BID) | ORAL | Status: DC
  Administered 2015-05-28 – 2015-05-31 (×6): 3.125 mg via ORAL
  Filled 2015-05-28 (×6): qty 1

## 2015-05-28 MED ORDER — LIDOCAINE HCL (PF) 1 % IJ SOLN
INTRAMUSCULAR | Status: AC
  Filled 2015-05-28: qty 30

## 2015-05-28 MED ORDER — LIDOCAINE HCL (PF) 1 % IJ SOLN
INTRAMUSCULAR | Status: DC | PRN
  Administered 2015-05-28: 21:00:00

## 2015-05-28 MED ORDER — HEPARIN SODIUM (PORCINE) 1000 UNIT/ML IJ SOLN
INTRAMUSCULAR | Status: DC | PRN
  Administered 2015-05-28: 2000 [IU] via INTRAVENOUS
  Administered 2015-05-28: 8000 [IU] via INTRAVENOUS

## 2015-05-28 MED ORDER — HEPARIN (PORCINE) IN NACL 100-0.45 UNIT/ML-% IJ SOLN
1000.0000 [IU]/h | INTRAMUSCULAR | Status: DC
  Administered 2015-05-28: 850 [IU]/h via INTRAVENOUS
  Filled 2015-05-28: qty 250

## 2015-05-28 MED ORDER — MIDAZOLAM HCL 2 MG/2ML IJ SOLN
INTRAMUSCULAR | Status: AC
  Filled 2015-05-28: qty 2

## 2015-05-28 MED ORDER — ONDANSETRON HCL 4 MG/2ML IJ SOLN
INTRAMUSCULAR | Status: AC
  Filled 2015-05-28: qty 2

## 2015-05-28 MED ORDER — NITROGLYCERIN IN D5W 200-5 MCG/ML-% IV SOLN
INTRAVENOUS | Status: AC
  Administered 2015-05-28: 10 ug/min
  Filled 2015-05-28: qty 250

## 2015-05-28 MED ORDER — IOHEXOL 350 MG/ML SOLN
INTRAVENOUS | Status: DC | PRN
  Administered 2015-05-28: 70 mL via INTRA_ARTERIAL

## 2015-05-28 MED ORDER — LIDOCAINE HCL (PF) 1 % IJ SOLN
INTRAMUSCULAR | Status: DC | PRN
  Administered 2015-05-28: 5 mL

## 2015-05-28 MED ORDER — SODIUM CHLORIDE 0.9 % IV SOLN
INTRAVENOUS | Status: DC | PRN
  Administered 2015-05-28: 79 mL/h via INTRAVENOUS

## 2015-05-28 MED ORDER — IOHEXOL 350 MG/ML SOLN
INTRAVENOUS | Status: DC | PRN
  Administered 2015-05-28: 130 mL via INTRA_ARTERIAL

## 2015-05-28 MED ORDER — SODIUM CHLORIDE 0.9% FLUSH
3.0000 mL | Freq: Two times a day (BID) | INTRAVENOUS | Status: DC
  Administered 2015-05-29 – 2015-05-30 (×2): 3 mL via INTRAVENOUS

## 2015-05-28 MED ORDER — NITROGLYCERIN 0.4 MG SL SUBL
SUBLINGUAL_TABLET | SUBLINGUAL | Status: AC
  Administered 2015-05-28: 0.4 mg
  Filled 2015-05-28: qty 1

## 2015-05-28 MED ORDER — MORPHINE SULFATE (PF) 2 MG/ML IV SOLN
INTRAVENOUS | Status: AC
  Administered 2015-05-28: 2 mg via INTRAVENOUS
  Filled 2015-05-28: qty 1

## 2015-05-28 MED ORDER — BIVALIRUDIN BOLUS VIA INFUSION - CUPID
INTRAVENOUS | Status: DC | PRN
  Administered 2015-05-28: 59.25 mg via INTRAVENOUS

## 2015-05-28 MED ORDER — MORPHINE SULFATE (PF) 2 MG/ML IV SOLN
INTRAVENOUS | Status: AC
  Filled 2015-05-28: qty 1

## 2015-05-28 MED ORDER — SODIUM CHLORIDE 0.9 % WEIGHT BASED INFUSION
84.0000 mL/h | INTRAVENOUS | Status: DC
  Administered 2015-05-28: 19:00:00 via INTRAVENOUS

## 2015-05-28 MED ORDER — ATORVASTATIN CALCIUM 80 MG PO TABS
80.0000 mg | ORAL_TABLET | Freq: Every day | ORAL | Status: DC
  Administered 2015-05-29 – 2015-05-30 (×2): 80 mg via ORAL
  Filled 2015-05-28 (×2): qty 1

## 2015-05-28 MED ORDER — TIROFIBAN (AGGRASTAT) BOLUS VIA INFUSION
INTRAVENOUS | Status: DC | PRN
  Administered 2015-05-28: 2097.5 ug via INTRAVENOUS

## 2015-05-28 MED ORDER — TICAGRELOR 90 MG PO TABS
ORAL_TABLET | ORAL | Status: DC | PRN
  Administered 2015-05-28: 180 mg via ORAL

## 2015-05-28 MED ORDER — TIROFIBAN HCL IN NACL 5-0.9 MG/100ML-% IV SOLN
INTRAVENOUS | Status: AC
  Filled 2015-05-28: qty 100

## 2015-05-28 MED ORDER — SODIUM CHLORIDE 0.9 % IV SOLN
250.0000 mg | INTRAVENOUS | Status: DC | PRN
  Administered 2015-05-28: 1.75 mg/kg/h via INTRAVENOUS

## 2015-05-28 MED ORDER — SODIUM CHLORIDE 0.9 % IV SOLN: 250.0000 mL | INTRAVENOUS | Status: DC | PRN

## 2015-05-28 MED ORDER — BIVALIRUDIN 250 MG IV SOLR
INTRAVENOUS | Status: AC
  Filled 2015-05-28: qty 250

## 2015-05-28 MED ORDER — TICAGRELOR 90 MG PO TABS
90.0000 mg | ORAL_TABLET | Freq: Two times a day (BID) | ORAL | Status: DC
  Administered 2015-05-28 – 2015-05-31 (×6): 90 mg via ORAL
  Filled 2015-05-28 (×6): qty 1

## 2015-05-28 MED ORDER — FENTANYL CITRATE (PF) 100 MCG/2ML IJ SOLN
INTRAMUSCULAR | Status: AC
  Filled 2015-05-28: qty 2

## 2015-05-28 MED ORDER — CARVEDILOL 3.125 MG PO TABS: 3.1250 mg | ORAL_TABLET | Freq: Two times a day (BID) | ORAL | Status: DC

## 2015-05-28 MED ORDER — TIROFIBAN HCL IN NACL 5-0.9 MG/100ML-% IV SOLN
INTRAVENOUS | Status: DC | PRN
  Administered 2015-05-28: 0.15 ug/kg/min via INTRAVENOUS

## 2015-05-28 SURGICAL SUPPLY — 18 items
BALLN EMERGE MR 2.5X15 (BALLOONS) ×3
BALLN ~~LOC~~ EMERGE MR 3.0X15 (BALLOONS) ×3
BALLOON EMERGE MR 2.5X15 (BALLOONS) ×1 IMPLANT
BALLOON ~~LOC~~ EMERGE MR 3.0X15 (BALLOONS) ×1 IMPLANT
CATH OPTICROSS 40MHZ (CATHETERS) ×3 IMPLANT
CATH VISTA GUIDE 6FR XBLAD3.5 (CATHETERS) ×3 IMPLANT
ELECT DEFIB PAD ADLT CADENCE (PAD) ×3 IMPLANT
KIT ENCORE 26 ADVANTAGE (KITS) ×3 IMPLANT
KIT HEART LEFT (KITS) ×3 IMPLANT
PACK CARDIAC CATHETERIZATION (CUSTOM PROCEDURE TRAY) ×3 IMPLANT
SHEATH PINNACLE 6F 10CM (SHEATH) ×3 IMPLANT
SHEATH PINNACLE 7F 10CM (SHEATH) ×3 IMPLANT
SLED PULL BACK IVUS (MISCELLANEOUS) ×3 IMPLANT
STENT PROMUS PREM MR 2.75X12 (Permanent Stent) ×3 IMPLANT
TRANSDUCER W/STOPCOCK (MISCELLANEOUS) ×3 IMPLANT
TUBING CIL FLEX 10 FLL-RA (TUBING) ×3 IMPLANT
WIRE ASAHI PROWATER 300CM (WIRE) ×3 IMPLANT
WIRE EMERALD 3MM-J .035X150CM (WIRE) ×3 IMPLANT

## 2015-05-28 SURGICAL SUPPLY — 21 items
BALLN EMERGE MR 2.5X15 (BALLOONS) ×2
BALLN ~~LOC~~ EUPHORA RX 3.0X15 (BALLOONS) ×2
BALLOON EMERGE MR 2.5X15 (BALLOONS) ×1 IMPLANT
BALLOON ~~LOC~~ EUPHORA RX 3.0X15 (BALLOONS) ×1 IMPLANT
CATH INFINITI 5FR ANG PIGTAIL (CATHETERS) ×2 IMPLANT
CATH INFINITI JR4 5F (CATHETERS) ×2 IMPLANT
CATH VISTA GUIDE 6FR XBLAD3.0 (CATHETERS) ×2 IMPLANT
DEVICE RAD COMP TR BAND LRG (VASCULAR PRODUCTS) ×2 IMPLANT
GLIDESHEATH SLEND SS 6F .021 (SHEATH) ×2 IMPLANT
KIT ENCORE 26 ADVANTAGE (KITS) ×2 IMPLANT
KIT HEART LEFT (KITS) ×2 IMPLANT
PACK CARDIAC CATHETERIZATION (CUSTOM PROCEDURE TRAY) ×2 IMPLANT
SHEATH PINNACLE 6F 10CM (SHEATH) IMPLANT
STENT PROMUS PREM MR 3.0X20 (Permanent Stent) ×2 IMPLANT
SYR MEDRAD MARK V 150ML (SYRINGE) ×2 IMPLANT
TRANSDUCER W/STOPCOCK (MISCELLANEOUS) ×2 IMPLANT
TUBING CIL FLEX 10 FLL-RA (TUBING) ×2 IMPLANT
WIRE ASAHI PROWATER 180CM (WIRE) ×2 IMPLANT
WIRE EMERALD 3MM-J .035X150CM (WIRE) IMPLANT
WIRE HITORQ VERSACORE ST 145CM (WIRE) ×2 IMPLANT
WIRE SAFE-T 1.5MM-J .035X260CM (WIRE) ×2 IMPLANT

## 2015-05-28 NOTE — H&P (Signed)
Patient ID: [place] Bute MRN: [id number], DOB/AGE: 07-01-1955   Admit date: 05/28/2015  Consulting Physician: Karen Chafe Primary Physician: No PCP Per Patient Primary Cardiologist: None Reason for admission: chest pain/ anterior STEMI  Pt. Profile:  Regis Capurro is a 60 y.o. female with a history of obesity, HTN, HLD, pre-diabetes, asthma and a family hx of CAD who presented to [facility] as an anterior STEMI.  No past cardiac history or TIA/CVA. She has a family history of CAD in her father and brother. Her brother had his first event at the age of 58. She had chest pain a couple years back and had a myoview that was normal ( not in our system).   She was in her usual state of health until this afternoon when she was in her friends office. She stated that she smelt something bad and left to go to her car. SHe started driving when she had chest pressure. This was central and radiated to her back and down her arms. She felt mildly SOB, nauseated and very diaphoretic. She called EMS and ECG showed ST elevation in V3-V6. She was brought emergently to [facility] as a CODE STEMI. In cath lab she is having 10/10 pain.    Problem List  Past Medical History  Diagnosis Date  . Asthma   . Hypertension     Past Surgical History  Procedure Laterality Date  . Tubal ligation       Allergies  No Known Allergies   Home Medications  Prior to Admission medications   Medication Sig Start Date End Date Taking? Authorizing Provider  cefUROXime (CEFTIN) 250 MG tablet Take one tablet BID with food 04/19/15   Lorin Picket, PA-C  fexofenadine-pseudoephedrine (ALLEGRA-D 24) 180-240 MG 24 hr tablet Take 1 tablet by mouth daily.    Historical Provider, MD  fluticasone (FLONASE) 50 MCG/ACT nasal spray Place 1 spray into both nostrils daily.    Historical Provider, MD  ibuprofen (ADVIL,MOTRIN) 800 MG tablet Take 1 tablet (800 mg total) by mouth every 8 (eight) hours as needed. 12/11/14   Olen Cordial, NP  lisinopril (PRINIVIL,ZESTRIL) 20 MG tablet Take 20 mg by mouth daily.    Historical Provider, MD    Family History  No family history on file. No family status information on file.     Social History  Social History   Social History  . Marital Status: Married    Spouse Name: N/A  . Number of Children: N/A  . Years of Education: N/A   Occupational History  . Not on file.   Social History Main Topics  . Smoking status: Never Smoker   . Smokeless tobacco: Never Used  . Alcohol Use: Not on file  . Drug Use: No  . Sexual Activity: Yes    Birth Control/ Protection: Post-menopausal   Other Topics Concern  . Not on file   Social History Narrative     All other systems reviewed and are otherwise negative except as noted above.  Physical Exam  There were no vitals taken for this visit.  General: Pleasant, NAD. Obese, pale, diaphoretic and mild distress Psych: Normal affect. Neuro: Alert and oriented X 3. Moves all extremities spontaneously. HEENT: Normal  Neck: Supple without bruits or JVD. Lungs:  Resp regular and unlabored, CTA. Heart: RRR no s3, s4, or murmurs. Abdomen: Soft, non-tender, non-distended, BS + x 4.  Extremities: No clubbing, cyanosis or edema. DP/PT/Radials 2+ and equal bilaterally.  Labs  No results for input(s): CKTOTAL, CKMB, TROPONINI in the last 72 hours. Lab Results  Component Value Date   WBC 8.0 12/16/2012   HGB 14.7 12/16/2012   HCT 42.9 12/16/2012   MCV 91 12/16/2012   PLT 274 12/16/2012   No results for input(s): NA, K, CL, CO2, BUN, CREATININE, CALCIUM, PROT, BILITOT, ALKPHOS, ALT, AST, GLUCOSE in the last 168 hours.  Invalid input(s): LABALBU Lab Results  Component Value Date   CHOL 145 03/15/2012   HDL 23* 03/15/2012   LDLCALC 80 03/15/2012   TRIG 209* 03/15/2012   No results found for: DDIMER   Radiology/Studies  Dg Bone Density  05/03/2015  EXAM: DUAL X-RAY ABSORPTIOMETRY (DXA) FOR BONE MINERAL  DENSITY IMPRESSION: Dear Dr. Leretha Pol, Your patient JOURNIE KROTZER completed a FRAX assessment on 05/03/2015 using the [place] (analysis version: 14.10) manufactured by [employer]. The following summarizes the results of our evaluation. PATIENT BIOGRAPHICAL: Name: Elycia, Rothschild Patient ID: [id number] Birth Date: 1956/03/25 Height:    61.3 in. Gender:     Female    [age]       Weight:    187.4 lbs. Ethnicity:  White                            Exam Date: 05/03/2015 FRAX* RESULTS:  (version: 3.5) 10-year Probability of Fracture1 Major Osteoporotic Fracture2 Hip Fracture 12.8% 1.3% Population: [place] (Caucasian) Risk Factors: History of Fracture (Adult) Based on Femur (Left) Neck BMD 1 -The 10-year probability of fracture may be lower than reported if the patient has received treatment. 2 -Major Osteoporotic Fracture: Clinical Spine, Forearm, Hip or Shoulder *FRAX is a Materials engineer of the [employer] for Metabolic Bone Disease, a [place] (WHO) [facility]. ASSESSMENT: The probability of a major osteoporotic fracture is 12.8% within the next ten years. The probability of a hip fracture is 1.3% within the next ten years. . Dear Dr. Leretha Pol, Your patient Sayde Top completed a BMD test on 05/03/2015 using the [place] (analysis version: 14.10) manufactured by [employer]. The following summarizes the results of our evaluation. PATIENT BIOGRAPHICAL: Name: Randye, Hugley Patient ID: [id number] Birth Date: 04-Sep-1955 Height: 61.3 in. Gender: Female Exam Date: 05/03/2015 Weight: 187.4 lbs. Indications: Caucasian, History of Fracture (Adult), Postmenopausal Fractures: Foot Treatments: CALCIUM VIT D, Multi-Vitamin with calcium ASSESSMENT: The BMD measured at Femur Neck Left is 0.796 g/cm2 with a T-score of -1.7. This patient is considered osteopenic according to [facility])  criteria. Site Region Measured Measured WHO Young Adult BMD Date       Age      Classification T-score AP Spine L1-L4 05/03/2015 57.1 Osteopenia -1.3 1.033 g/cm2 DualFemur Neck Left 05/03/2015 57.1 Osteopenia -1.7 0.796 g/cm2 World Health Organization [facility]) criteria for post-menopausal, Caucasian Women: Normal:       T-score at or above -1 SD Osteopenia:   T-score between -1 and -2.5 SD Osteoporosis: T-score at or below -2.5 SD RECOMMENDATIONS: [place] recommends that FDA-approved medical therapies be considered in postmenopausal women and men age 8 or older with a: 1. Hip or vertebral (clinical or morphometric) fracture. 2. T-score of < -2.5 at the spine or hip. 3. Ten-year fracture probability by FRAX of 3% or greater for hip fracture or 20% or greater for major osteoporotic fracture. All treatment decisions require clinical  judgment and consideration of individual patient factors, including patient preferences, co-morbidities, previous drug use, risk factors not captured in the FRAX model (e.g. falls, vitamin D deficiency, increased bone turnover, interval significant decline in bone density) and possible under - or over-estimation of fracture risk by FRAX. All patients should ensure an adequate intake of dietary calcium (1200 mg/d) and vitamin D (800 IU daily) unless contraindicated. FOLLOW-UP: People with diagnosed cases of osteoporosis or at high risk for fracture should have regular bone mineral density tests. For patients eligible for Medicare, routine testing is allowed once every 2 years. The testing frequency can be increased to one year for patients who have rapidly progressing disease, those who are receiving or discontinuing medical therapy to restore bone mass, or have additional risk factors. I have reviewed this report, and agree with the above findings. [facility] Electronically Signed   By: Lahoma Crocker M.D.   On: 05/03/2015 14:35   Mm Digital Screening  Bilateral  05/03/2015  CLINICAL DATA:  Screening. EXAM: DIGITAL SCREENING BILATERAL MAMMOGRAM WITH CAD COMPARISON:  Previous exam(s). ACR Breast Density Category c: The breast tissue is heterogeneously dense, which may obscure small masses. FINDINGS: In the right breast, a possible asymmetry warrants further evaluation. In the left breast, no findings suspicious for malignancy. Images were processed with CAD. IMPRESSION: Further evaluation is suggested for possible asymmetry in the right breast. RECOMMENDATION: Diagnostic mammogram and possibly ultrasound of the right breast. (Code:[id number]-R-91M) The patient will be contacted regarding the findings, and additional imaging will be scheduled. BI-RADS CATEGORY  0: Incomplete. Need additional imaging evaluation and/or prior mammograms for comparison. Electronically Signed   By: Lovey Newcomer M.D.   On: 05/03/2015 15:55   US Breast Ltd Uni Right Inc Axilla  05/10/2015  CLINICAL DATA:  Possible asymmetry in the lateral right breast on a recent 2D screening mammogram. EXAM: DIGITAL DIAGNOSTIC RIGHT MAMMOGRAM WITH 3D TOMOSYNTHESIS WITH CAD ULTRASOUND RIGHT BREAST COMPARISON:  Previous exam(s). ACR Breast Density Category c: The breast tissue is heterogeneously dense, which may obscure small masses. FINDINGS: 3D tomographic images of the right breast confirm an oval, circumscribed mass in the posterior aspect of the lateral portion of the breast, in approximately 9-10 o'clock position. There are multiple smaller, similar-appearing masses. Mammographic images were processed with CAD. On physical exam, no mass is palpable in the outer right breast. Targeted ultrasound is performed, showing multiple cysts in the outer right breast. These include a 1.2 cm cyst in the posterior aspect of the 9:30 o'clock position, corresponding to the mammographic mass. There is also a 1.3 cm cyst containing some internal echoes and thin internal septations in the 10 o'clock position, 5 cm from the  nipple. There are multiple additional smaller cysts. IMPRESSION: Multiple right breast cysts.  No evidence of malignancy. RECOMMENDATION: Bilateral screening mammogram in 1 year. I have discussed the findings and recommendations with the patient. Results were also provided in writing at the conclusion of the visit. If applicable, a reminder letter will be sent to the patient regarding the next appointment. BI-RADS CATEGORY  2: Benign. Electronically Signed   By: Claudie Revering M.D.   On: 05/10/2015 19:29   Mm Diag Breast Tomo Uni Right  05/10/2015  CLINICAL DATA:  Possible asymmetry in the lateral right breast on a recent 2D screening mammogram. EXAM: DIGITAL DIAGNOSTIC RIGHT MAMMOGRAM WITH 3D TOMOSYNTHESIS WITH CAD ULTRASOUND RIGHT BREAST COMPARISON:  Previous exam(s). ACR Breast Density Category c: The breast tissue is heterogeneously dense, which may obscure  small masses. FINDINGS: 3D tomographic images of the right breast confirm an oval, circumscribed mass in the posterior aspect of the lateral portion of the breast, in approximately 9-10 o'clock position. There are multiple smaller, similar-appearing masses. Mammographic images were processed with CAD. On physical exam, no mass is palpable in the outer right breast. Targeted ultrasound is performed, showing multiple cysts in the outer right breast. These include a 1.2 cm cyst in the posterior aspect of the 9:30 o'clock position, corresponding to the mammographic mass. There is also a 1.3 cm cyst containing some internal echoes and thin internal septations in the 10 o'clock position, 5 cm from the nipple. There are multiple additional smaller cysts. IMPRESSION: Multiple right breast cysts.  No evidence of malignancy. RECOMMENDATION: Bilateral screening mammogram in 1 year. I have discussed the findings and recommendations with the patient. Results were also provided in writing at the conclusion of the visit. If applicable, a reminder letter will be sent to the  patient regarding the next appointment. BI-RADS CATEGORY  2: Benign. Electronically Signed   By: Claudie Revering M.D.   On: 05/10/2015 19:29    ECG  NSR HR 60. ST elevation in V2-V6  ASSESSMENT AND PLAN  Conchetta Molton is a 60 y.o. female with a history of obesity, HTN, HLD, pre-diabetes, asthma and a family hx of CAD who presented to [facility] as an anterior STEMI.  PLAN: emergent cardiac catheterization with possible PCI    Signed, Crista Luria 05/28/2015, 5:29 PM  Pager [phone] Patient seen and examined and history reviewed. Agree with above findings and plan. 60 yo WF transported by [facility]. EMS for acute anterior STEMI. Pain onset 30 minutes prior to EMS arrival. Ecg shows ST elevation in the anterior precordial leads. History of HTN, prediabetes, and family history of early CAD. Plan: emergent cardiac cath and PCI.   Vidyuth Belsito [place], [place] 05/28/2015 6:35 PM

## 2015-05-28 NOTE — Progress Notes (Signed)
ANTICOAGULATION CONSULT NOTE - Initial Consult  Pharmacy Consult for heparin  Indication: STEMI, s/p cath, heavy clot burden  No Known Allergies  Patient Measurements: Height: 5\' 4"  (162.6 cm) Weight: 185 lb (83.915 kg) IBW/kg (Calculated) : 54.7 Heparin Dosing Weight: 73.2 kg  Vital Signs: Temp: 97.9 F (36.6 C) (02/10 1850) Temp Source: Oral (02/10 1850) BP: 148/99 mmHg (02/10 2043) Pulse Rate: 0 (02/10 2048)  Labs:  Recent Labs  05/28/15 1740 05/28/15 1750 05/28/15 1908  HGB 13.0 14.3  --   HCT 39.3 42.0  --   PLT 284  --   --   APTT  --   --  38*  LABPROT  --   --  18.3*  INR  --   --  1.52*  CREATININE 0.73 0.60  --   CKTOTAL 136  --   --   CKMB 4.3  --   --   TROPONINI 0.03  --   --     Estimated Creatinine Clearance: 81.3 mL/min (by C-G formula based on Cr of 0.6).   Medical History: Past Medical History  Diagnosis Date  . Asthma   . Hypertension     Medications:  Scheduled:  . aspirin  81 mg Oral Daily  . [START ON 05/29/2015] atorvastatin  80 mg Oral q1800  . carvedilol  3.125 mg Oral BID WC  . morphine      . sodium chloride flush  3 mL Intravenous Q12H  . ticagrelor  90 mg Oral BID    Assessment: 60 yo female admitted with anterior STEMI s/p DES of proximal LAD, had repeat STEMI 30 min after arrival to [facility] and taken back to cath lab for repeat stenting of LAD and PTCA.  Sheath to be left in place overnight, and Dr. Martinique asked pharmacy to begin IV heparin overnight with no bolus.  Also on tirofiban as well x 18 hrs.  Goal of Therapy:  Heparin level 0.3-0.5 while on tirofiban  Monitor platelets by anticoagulation protocol: Yes   Plan:  1. Start IV heparin at 850 units/hr.  No bolus. 2. Check heparin level 6 hrs after gtt started. 3. CBC 6 hrs after tirofiban start.  Uvaldo Rising, BCPS  Clinical Pharmacist Pager [phone]  05/28/2015 9:48 PM

## 2015-05-29 ENCOUNTER — Encounter (HOSPITAL_COMMUNITY): Payer: Self-pay | Admitting: *Deleted

## 2015-05-29 ENCOUNTER — Inpatient Hospital Stay (HOSPITAL_COMMUNITY): Payer: Managed Care, Other (non HMO)

## 2015-05-29 DIAGNOSIS — I2510 Atherosclerotic heart disease of native coronary artery without angina pectoris: Secondary | ICD-10-CM

## 2015-05-29 LAB — CBC
HCT: 37.7 % (ref 36.0–46.0)
HEMOGLOBIN: 12.2 g/dL (ref 12.0–15.0)
MCH: 29.5 pg (ref 26.0–34.0)
MCHC: 32.4 g/dL (ref 30.0–36.0)
MCV: 91.1 fL (ref 78.0–100.0)
PLATELETS: 273 10*3/uL (ref 150–400)
RBC: 4.14 MIL/uL (ref 3.87–5.11)
RDW: 13.1 % (ref 11.5–15.5)
WBC: 12.7 10*3/uL — ABNORMAL HIGH (ref 4.0–10.5)

## 2015-05-29 LAB — POCT ACTIVATED CLOTTING TIME
ACTIVATED CLOTTING TIME: 152 s
Activated Clotting Time: 126 seconds

## 2015-05-29 LAB — BASIC METABOLIC PANEL
Anion gap: 10 (ref 5–15)
BUN: 8 mg/dL (ref 6–20)
CHLORIDE: 104 mmol/L (ref 101–111)
CO2: 24 mmol/L (ref 22–32)
Calcium: 8.3 mg/dL — ABNORMAL LOW (ref 8.9–10.3)
Creatinine, Ser: 0.48 mg/dL (ref 0.44–1.00)
GFR calc Af Amer: >60 mL/min (ref >60)
GFR calc non Af Amer: >60 mL/min (ref >60)
GLUCOSE: 170 mg/dL — AB (ref 65–99)
POTASSIUM: 3.8 mmol/L (ref 3.5–5.1)
Sodium: 138 mmol/L (ref 135–145)

## 2015-05-29 LAB — TROPONIN I
TROPONIN I: 1.02 ng/mL — AB (ref <0.031)
TROPONIN I: 3.71 ng/mL — AB (ref <0.031)
Troponin I: 4.38 ng/mL (ref <0.031)

## 2015-05-29 LAB — TYPE AND SCREEN
ABO/RH(D): O POS
ANTIBODY SCREEN: NEGATIVE

## 2015-05-29 LAB — HEMOGLOBIN A1C
HEMOGLOBIN A1C: 7.2 % — AB (ref 4.8–5.6)
MEAN PLASMA GLUCOSE: 160 mg/dL

## 2015-05-29 LAB — ABO/RH: ABO/RH(D): O POS

## 2015-05-29 LAB — HEPARIN LEVEL (UNFRACTIONATED): Heparin Unfractionated: <0.1 IU/mL — ABNORMAL LOW (ref 0.30–0.70)

## 2015-05-29 MED ORDER — LISINOPRIL 5 MG PO TABS
5.0000 mg | ORAL_TABLET | Freq: Every day | ORAL | Status: DC
  Administered 2015-05-29 – 2015-05-31 (×3): 5 mg via ORAL
  Filled 2015-05-29 (×3): qty 1

## 2015-05-29 MED ORDER — ATROPINE SULFATE 0.1 MG/ML IJ SOLN
INTRAMUSCULAR | Status: AC
  Filled 2015-05-29: qty 10

## 2015-05-29 NOTE — Progress Notes (Signed)
Right femoral sheath removed at this time. Site level 1 prior to sheath removal. DP pulse dopplerable prior to sheath removal. Manual pressure applied for 20 mins beginning at 1128 ending at 1148. Site s/p sheath removal remain level 1. Post cath instructions given. DP pulse dopplerable. Pressure bandage applied. Pt tolerated procedure well. Will continue to monitor.

## 2015-05-29 NOTE — Progress Notes (Signed)
Patient ID: Stepheny Ambrosia, female   DOB: [birth year], 60 y.o.   MRN: [id number]    SUBJECTIVE: No chest pain this morning.  Anterior MI 2/10 with DES to pLAD.  A couple hours later she re-occluded LAD and had repeat DES to pLAD.  Heparin and tirofiban are still running.   Scheduled Meds: . aspirin  81 mg Oral Daily  . atorvastatin  80 mg Oral q1800  . carvedilol  3.125 mg Oral BID WC  . lisinopril  5 mg Oral Daily  . sodium chloride flush  3 mL Intravenous Q12H  . sodium chloride flush  3 mL Intravenous Q12H  . ticagrelor  90 mg Oral BID   Continuous Infusions: . nitroGLYCERIN 5 mcg/min (05/29/15 0600)  . tirofiban 0.15 mcg/kg/min (05/29/15 0800)   PRN Meds:.sodium chloride, sodium chloride, acetaminophen, morphine injection, ondansetron (ZOFRAN) IV, sodium chloride flush, sodium chloride flush    Filed Vitals:   05/29/15 0730 05/29/15 0745 05/29/15 0800 05/29/15 0900  BP:  142/77 128/75   Pulse: 73 66 69 77  Temp:   99.2 F (37.3 C)   TempSrc:   Oral   Resp: 14 19 14 14   Height:      Weight:      SpO2: 95% 96% 96% 100%    Intake/Output Summary (Last 24 hours) at 05/29/15 0928 Last data filed at 05/29/15 0900  Gross per 24 hour  Intake 525.75 ml  Output    600 ml  Net -74.25 ml    LABS: Basic Metabolic Panel:  Recent Labs  05/28/15 1740 05/28/15 1750 05/29/15 0430  NA 139 141 138  K 3.0* 3.0* 3.8  CL 103 102 104  CO2 25  --  24  GLUCOSE 200* 203* 170*  BUN 13 15 8   CREATININE 0.73 0.60 0.48  CALCIUM 8.5*  --  8.3*   Liver Function Tests:  Recent Labs  05/28/15 1740  AST 25  ALT 31  ALKPHOS 121  BILITOT 0.3  PROT 6.7  ALBUMIN 3.4*   No results for input(s): LIPASE, AMYLASE in the last 72 hours. CBC:  Recent Labs  05/28/15 1740 05/28/15 1750 05/29/15 0235  WBC 13.8*  --  12.7*  HGB 13.0 14.3 12.2  HCT 39.3 42.0 37.7  MCV 91.0  --  91.1  PLT 284  --  273   Cardiac Enzymes:  Recent Labs  05/28/15 1740 05/28/15 2308  05/29/15 0430  CKTOTAL 136  --   --   CKMB 4.3  --   --   TROPONINI 0.03 1.02* 3.71*   BNP: Invalid input(s): POCBNP D-Dimer: No results for input(s): DDIMER in the last 72 hours. Hemoglobin A1C:  Recent Labs  05/28/15 1740  HGBA1C 7.2*   Fasting Lipid Panel:  Recent Labs  05/28/15 1740  CHOL 176  HDL 26*  LDLCALC 109*  TRIG 204*  CHOLHDL 6.8   Thyroid Function Tests: No results for input(s): TSH, T4TOTAL, T3FREE, THYROIDAB in the last 72 hours.  Invalid input(s): FREET3 Anemia Panel: No results for input(s): VITAMINB12, FOLATE, FERRITIN, TIBC, IRON, RETICCTPCT in the last 72 hours.  RADIOLOGY: Dg Bone Density  05/03/2015  EXAM: DUAL X-RAY ABSORPTIOMETRY (DXA) FOR BONE MINERAL DENSITY IMPRESSION: Dear Dr. Leretha Pol, Your patient MONNA SHIELS completed a FRAX assessment on 05/03/2015 using the [place] (analysis version: 14.10) manufactured by [employer]. The following summarizes the results of our evaluation. PATIENT BIOGRAPHICAL: Name: Tischa, Coopman Patient ID: [id number] Birth Date: Apr 26, 1955 Height:  61.3 in. Gender:     Female    [age]       Weight:    187.4 lbs. Ethnicity:  White                            Exam Date: 05/03/2015 FRAX* RESULTS:  (version: 3.5) 10-year Probability of Fracture1 Major Osteoporotic Fracture2 Hip Fracture 12.8% 1.3% Population: [place] (Caucasian) Risk Factors: History of Fracture (Adult) Based on Femur (Left) Neck BMD 1 -The 10-year probability of fracture may be lower than reported if the patient has received treatment. 2 -Major Osteoporotic Fracture: Clinical Spine, Forearm, Hip or Shoulder *FRAX is a Materials engineer of the [employer] for Metabolic Bone Disease, a [place] (WHO) [facility]. ASSESSMENT: The probability of a major osteoporotic fracture is 12.8% within the next ten years. The probability of a hip fracture is 1.3% within the next ten  years. . Dear Dr. Leretha Pol, Your patient Kayli Mileski completed a BMD test on 05/03/2015 using the [place] (analysis version: 14.10) manufactured by [employer]. The following summarizes the results of our evaluation. PATIENT BIOGRAPHICAL: Name: Penni, Kabacinski Patient ID: [id number] Birth Date: 15-Jul-1955 Height: 61.3 in. Gender: Female Exam Date: 05/03/2015 Weight: 187.4 lbs. Indications: Caucasian, History of Fracture (Adult), Postmenopausal Fractures: Foot Treatments: CALCIUM VIT D, Multi-Vitamin with calcium ASSESSMENT: The BMD measured at Femur Neck Left is 0.796 g/cm2 with a T-score of -1.7. This patient is considered osteopenic according to [facility]) criteria. Site Region Measured Measured WHO Young Adult BMD Date       Age      Classification T-score AP Spine L1-L4 05/03/2015 57.1 Osteopenia -1.3 1.033 g/cm2 DualFemur Neck Left 05/03/2015 57.1 Osteopenia -1.7 0.796 g/cm2 World Health Organization [facility]) criteria for post-menopausal, Caucasian Women: Normal:       T-score at or above -1 SD Osteopenia:   T-score between -1 and -2.5 SD Osteoporosis: T-score at or below -2.5 SD RECOMMENDATIONS: [place] recommends that FDA-approved medical therapies be considered in postmenopausal women and men age 59 or older with a: 1. Hip or vertebral (clinical or morphometric) fracture. 2. T-score of < -2.5 at the spine or hip. 3. Ten-year fracture probability by FRAX of 3% or greater for hip fracture or 20% or greater for major osteoporotic fracture. All treatment decisions require clinical judgment and consideration of individual patient factors, including patient preferences, co-morbidities, previous drug use, risk factors not captured in the FRAX model (e.g. falls, vitamin D deficiency, increased bone turnover, interval significant decline in bone density) and possible under - or over-estimation of fracture risk by FRAX. All patients should ensure  an adequate intake of dietary calcium (1200 mg/d) and vitamin D (800 IU daily) unless contraindicated. FOLLOW-UP: People with diagnosed cases of osteoporosis or at high risk for fracture should have regular bone mineral density tests. For patients eligible for Medicare, routine testing is allowed once every 2 years. The testing frequency can be increased to one year for patients who have rapidly progressing disease, those who are receiving or discontinuing medical therapy to restore bone mass, or have additional risk factors. I have reviewed this report, and agree with the above findings. [facility] Radiology Electronically Signed   By: Lahoma Crocker M.D.   On: 05/03/2015 14:35   Mm Digital Screening Bilateral  05/03/2015  CLINICAL DATA:  Screening. EXAM: DIGITAL SCREENING  BILATERAL MAMMOGRAM WITH CAD COMPARISON:  Previous exam(s). ACR Breast Density Category c: The breast tissue is heterogeneously dense, which may obscure small masses. FINDINGS: In the right breast, a possible asymmetry warrants further evaluation. In the left breast, no findings suspicious for malignancy. Images were processed with CAD. IMPRESSION: Further evaluation is suggested for possible asymmetry in the right breast. RECOMMENDATION: Diagnostic mammogram and possibly ultrasound of the right breast. (Code:[id number]) The patient will be contacted regarding the findings, and additional imaging will be scheduled. BI-RADS CATEGORY  0: Incomplete. Need additional imaging evaluation and/or prior mammograms for comparison. Electronically Signed   By: Lovey Newcomer M.D.   On: 05/03/2015 15:55   US Breast Ltd Uni Right Inc Axilla  05/10/2015  CLINICAL DATA:  Possible asymmetry in the lateral right breast on a recent 2D screening mammogram. EXAM: DIGITAL DIAGNOSTIC RIGHT MAMMOGRAM WITH 3D TOMOSYNTHESIS WITH CAD ULTRASOUND RIGHT BREAST COMPARISON:  Previous exam(s). ACR Breast Density Category c: The breast tissue is heterogeneously dense, which may  obscure small masses. FINDINGS: 3D tomographic images of the right breast confirm an oval, circumscribed mass in the posterior aspect of the lateral portion of the breast, in approximately 9-10 o'clock position. There are multiple smaller, similar-appearing masses. Mammographic images were processed with CAD. On physical exam, no mass is palpable in the outer right breast. Targeted ultrasound is performed, showing multiple cysts in the outer right breast. These include a 1.2 cm cyst in the posterior aspect of the 9:30 o'clock position, corresponding to the mammographic mass. There is also a 1.3 cm cyst containing some internal echoes and thin internal septations in the 10 o'clock position, 5 cm from the nipple. There are multiple additional smaller cysts. IMPRESSION: Multiple right breast cysts.  No evidence of malignancy. RECOMMENDATION: Bilateral screening mammogram in 1 year. I have discussed the findings and recommendations with the patient. Results were also provided in writing at the conclusion of the visit. If applicable, a reminder letter will be sent to the patient regarding the next appointment. BI-RADS CATEGORY  2: Benign. Electronically Signed   By: Claudie Revering M.D.   On: 05/10/2015 19:29   Mm Diag Breast Tomo Uni Right  05/10/2015  CLINICAL DATA:  Possible asymmetry in the lateral right breast on a recent 2D screening mammogram. EXAM: DIGITAL DIAGNOSTIC RIGHT MAMMOGRAM WITH 3D TOMOSYNTHESIS WITH CAD ULTRASOUND RIGHT BREAST COMPARISON:  Previous exam(s). ACR Breast Density Category c: The breast tissue is heterogeneously dense, which may obscure small masses. FINDINGS: 3D tomographic images of the right breast confirm an oval, circumscribed mass in the posterior aspect of the lateral portion of the breast, in approximately 9-10 o'clock position. There are multiple smaller, similar-appearing masses. Mammographic images were processed with CAD. On physical exam, no mass is palpable in the outer right  breast. Targeted ultrasound is performed, showing multiple cysts in the outer right breast. These include a 1.2 cm cyst in the posterior aspect of the 9:30 o'clock position, corresponding to the mammographic mass. There is also a 1.3 cm cyst containing some internal echoes and thin internal septations in the 10 o'clock position, 5 cm from the nipple. There are multiple additional smaller cysts. IMPRESSION: Multiple right breast cysts.  No evidence of malignancy. RECOMMENDATION: Bilateral screening mammogram in 1 year. I have discussed the findings and recommendations with the patient. Results were also provided in writing at the conclusion of the visit. If applicable, a reminder letter will be sent to the patient regarding the next appointment. BI-RADS CATEGORY  2:  Benign. Electronically Signed   By: Claudie Revering M.D.   On: 05/10/2015 19:29    PHYSICAL EXAM General: NAD Neck: No JVD, no thyromegaly or thyroid nodule.  Lungs: Clear to auscultation bilaterally with normal respiratory effort. CV: Nondisplaced PMI.  Heart regular S1/S2, no S3/S4, no murmur.  No peripheral edema.   Abdomen: Soft, nontender, no hepatosplenomegaly, no distention.  Neurologic: Alert and oriented x 3.  Psych: Normal affect. Extremities: No clubbing or cyanosis. Sheath in right groin.    TELEMETRY: Reviewed telemetry pt in NSR  ASSESSMENT AND PLAN: 60 yo with history of HTN and strong family history of CAD presented with anterior STEMI, had DES to proximal LAD.  Shortly afterwards, she had re-occlusion of proximal LAD with repeat DES to pLAD.  1. CAD: s/p anterior STEMI with DES pLAD, followed by re-occlusion and repeat DES to pLAD. No chest pain this morning.  - Stop heparin and will get arterial sheath out later this morning.  - Stop tirofiban at 18 hrs.  - Continue ASA 81, ticagrelor, atorvastatin 80 daily.  - Continue Coreg - Add lisinopril 5 daily.  - Needs echo today.  2. HTN: On Coreg, adding lisinopril.    Loralie Champagne 05/29/2015 9:32 AM

## 2015-05-29 NOTE — Progress Notes (Signed)
In to begin patient education post MI/stent. Echo in progress. MI booklet and diet information left for patient to read over. Bedrest continues. Will follow up on Monday and educate prior to D/C.

## 2015-05-29 NOTE — Progress Notes (Signed)
Sheath in the right groin with slow trickle. Old guaze saturated with clot. Site cleaned. Manual pressure held for 15 mins. Bleeding stopped. Gauze and Tegaderm applied, then pressure dressing applied. Small bruise noted at insertion site. Right and left groin and thigh feel equal. No hematoma noted to right groin at this time. Patient tolerated well. Pt re-educated on restricted movement of extremity. Dr. Sung Amabile notified. CBC,  type and screen ordered and sent to lab. Will continue to monitor.

## 2015-05-29 NOTE — Progress Notes (Signed)
  Echocardiogram 2D Echocardiogram has been performed.  Brenda Guerrero 05/29/2015, 2:40 PM

## 2015-05-29 NOTE — Progress Notes (Addendum)
CRITICAL VALUE ALERT  Critical value received:  Troponin 1.02  Date of notification:  05/29/2015  Time of notification:  0230  Critical value read back:Yes.    Nurse who received alert:  Arnetha Courser, RN  MD notified (1st Fiore Detjen):  [facility]

## 2015-05-29 NOTE — Progress Notes (Signed)
ANTICOAGULATION CONSULT NOTE - Follow Up Consult  Pharmacy Consult for heparin and tirofiban Indication: STEMI w/ heavy clot burden  Labs:  Recent Labs  05/28/15 1740 05/28/15 1750 05/28/15 1908 05/28/15 2308 05/29/15 0235 05/29/15 0430  HGB 13.0 14.3  --   --  12.2  --   HCT 39.3 42.0  --   --  37.7  --   PLT 284  --   --   --  273  --   APTT  --   --  38*  --   --   --   LABPROT  --   --  18.3*  --   --   --   INR  --   --  1.52*  --   --   --   HEPARINUNFRC  --   --   --   --   --  <0.10*  CREATININE 0.73 0.60  --   --   --   --   CKTOTAL 136  --   --   --   --   --   CKMB 4.3  --   --   --   --   --   TROPONINI 0.03  --   --  1.02*  --   --      Assessment: 59yo female undetectable on heparin with initial dosing post-cath awaiting repeat PCI; also on tirofiban, Plt stable.  Goal of Therapy:  Heparin level 0.3-0.5 units/ml   Plan:  Will increase heparin gtt by 2 units/kg/hr to 1000 units/hr and check level in 6hr (vs f/u post PCI).  Wynona Neat, PharmD, BCPS  05/29/2015,5:42 AM

## 2015-05-30 LAB — CBC
HCT: 42.3 % (ref 36.0–46.0)
Hemoglobin: 13.5 g/dL (ref 12.0–15.0)
MCH: 29.7 pg (ref 26.0–34.0)
MCHC: 31.9 g/dL (ref 30.0–36.0)
MCV: 93 fL (ref 78.0–100.0)
PLATELETS: 245 10*3/uL (ref 150–400)
RBC: 4.55 MIL/uL (ref 3.87–5.11)
RDW: 13.4 % (ref 11.5–15.5)
WBC: 9.3 10*3/uL (ref 4.0–10.5)

## 2015-05-30 LAB — BASIC METABOLIC PANEL
ANION GAP: 12 (ref 5–15)
BUN: 11 mg/dL (ref 6–20)
CALCIUM: 9 mg/dL (ref 8.9–10.3)
CO2: 24 mmol/L (ref 22–32)
Chloride: 102 mmol/L (ref 101–111)
Creatinine, Ser: 0.77 mg/dL (ref 0.44–1.00)
Glucose, Bld: 201 mg/dL — ABNORMAL HIGH (ref 65–99)
Potassium: 3.5 mmol/L (ref 3.5–5.1)
SODIUM: 138 mmol/L (ref 135–145)

## 2015-05-30 NOTE — Progress Notes (Signed)
Patient ID: Brenda Guerrero, female   DOB: 1955-08-31, 60 y.o.   MRN: [id number]    SUBJECTIVE: No further chest pain.  Anterior MI 2/10 with DES to pLAD.  A couple hours later she re-occluded LAD and had repeat DES to pLAD.    Echo with EF 55-60%.   Scheduled Meds: . aspirin  81 mg Oral Daily  . atorvastatin  80 mg Oral q1800  . carvedilol  3.125 mg Oral BID WC  . lisinopril  5 mg Oral Daily  . sodium chloride flush  3 mL Intravenous Q12H  . sodium chloride flush  3 mL Intravenous Q12H  . ticagrelor  90 mg Oral BID   Continuous Infusions: . nitroGLYCERIN 5 mcg/min (05/29/15 0600)   PRN Meds:.sodium chloride, sodium chloride, acetaminophen, morphine injection, ondansetron (ZOFRAN) IV, sodium chloride flush, sodium chloride flush    Filed Vitals:   05/30/15 0600 05/30/15 0700 05/30/15 0800 05/30/15 0900  BP: 107/67 109/70 112/64 118/65  Pulse: 60 62 70 87  Temp:  98 F (36.7 C)    TempSrc:  Oral    Resp: 14 17 19 18   Height:      Weight:      SpO2: 99% 98% 95% 97%    Intake/Output Summary (Last 24 hours) at 05/30/15 0948 Last data filed at 05/30/15 0900  Gross per 24 hour  Intake  885.3 ml  Output    950 ml  Net  -64.7 ml    LABS: Basic Metabolic Panel:  Recent Labs  05/28/15 1740 05/28/15 1750 05/29/15 0430  NA 139 141 138  K 3.0* 3.0* 3.8  CL 103 102 104  CO2 25  --  24  GLUCOSE 200* 203* 170*  BUN 13 15 8   CREATININE 0.73 0.60 0.48  CALCIUM 8.5*  --  8.3*   Liver Function Tests:  Recent Labs  05/28/15 1740  AST 25  ALT 31  ALKPHOS 121  BILITOT 0.3  PROT 6.7  ALBUMIN 3.4*   No results for input(s): LIPASE, AMYLASE in the last 72 hours. CBC:  Recent Labs  05/29/15 0235 05/30/15 0545  WBC 12.7* 9.3  HGB 12.2 13.5  HCT 37.7 42.3  MCV 91.1 93.0  PLT 273 245   Cardiac Enzymes:  Recent Labs  05/28/15 1740 05/28/15 2308 05/29/15 0430 05/29/15 1117  CKTOTAL 136  --   --   --   CKMB 4.3  --   --   --   TROPONINI 0.03 1.02*  3.71* 4.38*   BNP: Invalid input(s): POCBNP D-Dimer: No results for input(s): DDIMER in the last 72 hours. Hemoglobin A1C:  Recent Labs  05/28/15 1740  HGBA1C 7.2*   Fasting Lipid Panel:  Recent Labs  05/28/15 1740  CHOL 176  HDL 26*  LDLCALC 109*  TRIG 204*  CHOLHDL 6.8   Thyroid Function Tests: No results for input(s): TSH, T4TOTAL, T3FREE, THYROIDAB in the last 72 hours.  Invalid input(s): FREET3 Anemia Panel: No results for input(s): VITAMINB12, FOLATE, FERRITIN, TIBC, IRON, RETICCTPCT in the last 72 hours.  RADIOLOGY: Dg Bone Density  05/03/2015  EXAM: DUAL X-RAY ABSORPTIOMETRY (DXA) FOR BONE MINERAL DENSITY IMPRESSION: Dear Dr. Leretha Pol, Your patient Brenda Guerrero completed a FRAX assessment on 05/03/2015 using the [place] (analysis version: 14.10) manufactured by [employer]. The following summarizes the results of our evaluation. PATIENT BIOGRAPHICAL: Name: Brenda Guerrero, Brenda Guerrero Patient ID: [id number] Birth Date: [birth year] Height:    61.3 in. Gender:  Female    [age]       Weight:    187.4 lbs. Ethnicity:  White                            Exam Date: 05/03/2015 FRAX* RESULTS:  (version: 3.5) 10-year Probability of Fracture1 Major Osteoporotic Fracture2 Hip Fracture 12.8% 1.3% Population: [place] (Caucasian) Risk Factors: History of Fracture (Adult) Based on Femur (Left) Neck BMD 1 -The 10-year probability of fracture may be lower than reported if the patient has received treatment. 2 -Major Osteoporotic Fracture: Clinical Spine, Forearm, Hip or Shoulder *FRAX is a Materials engineer of the [employer] for Metabolic Bone Disease, a [place] (WHO) [facility]. ASSESSMENT: The probability of a major osteoporotic fracture is 12.8% within the next ten years. The probability of a hip fracture is 1.3% within the next ten years. . Dear Dr. Leretha Pol, Your patient Brenda Guerrero completed a  BMD test on 05/03/2015 using the [place] (analysis version: 14.10) manufactured by [employer]. The following summarizes the results of our evaluation. PATIENT BIOGRAPHICAL: Name: Brenda Guerrero, Brenda Guerrero Patient ID: [id number] Birth Date: 1955/10/19 Height: 61.3 in. Gender: Female Exam Date: 05/03/2015 Weight: 187.4 lbs. Indications: Caucasian, History of Fracture (Adult), Postmenopausal Fractures: Foot Treatments: CALCIUM VIT D, Multi-Vitamin with calcium ASSESSMENT: The BMD measured at Femur Neck Left is 0.796 g/cm2 with a T-score of -1.7. This patient is considered osteopenic according to [facility]) criteria. Site Region Measured Measured WHO Young Adult BMD Date       Age      Classification T-score AP Spine L1-L4 05/03/2015 57.1 Osteopenia -1.3 1.033 g/cm2 DualFemur Neck Left 05/03/2015 57.1 Osteopenia -1.7 0.796 g/cm2 World Health Organization [facility]) criteria for post-menopausal, Caucasian Women: Normal:       T-score at or above -1 SD Osteopenia:   T-score between -1 and -2.5 SD Osteoporosis: T-score at or below -2.5 SD RECOMMENDATIONS: [place] recommends that FDA-approved medical therapies be considered in postmenopausal women and men age 70 or older with a: 1. Hip or vertebral (clinical or morphometric) fracture. 2. T-score of < -2.5 at the spine or hip. 3. Ten-year fracture probability by FRAX of 3% or greater for hip fracture or 20% or greater for major osteoporotic fracture. All treatment decisions require clinical judgment and consideration of individual patient factors, including patient preferences, co-morbidities, previous drug use, risk factors not captured in the FRAX model (e.g. falls, vitamin D deficiency, increased bone turnover, interval significant decline in bone density) and possible under - or over-estimation of fracture risk by FRAX. All patients should ensure an adequate intake of dietary calcium (1200 mg/d) and vitamin D (800 IU  daily) unless contraindicated. FOLLOW-UP: People with diagnosed cases of osteoporosis or at high risk for fracture should have regular bone mineral density tests. For patients eligible for Medicare, routine testing is allowed once every 2 years. The testing frequency can be increased to one year for patients who have rapidly progressing disease, those who are receiving or discontinuing medical therapy to restore bone mass, or have additional risk factors. I have reviewed this report, and agree with the above findings. [facility] Radiology Electronically Signed   By: Lahoma Crocker M.D.   On: 05/03/2015 14:35   Mm Digital Screening Bilateral  05/03/2015  CLINICAL DATA:  Screening. EXAM: DIGITAL SCREENING BILATERAL MAMMOGRAM WITH CAD COMPARISON:  Previous  exam(s). ACR Breast Density Category c: The breast tissue is heterogeneously dense, which may obscure small masses. FINDINGS: In the right breast, a possible asymmetry warrants further evaluation. In the left breast, no findings suspicious for malignancy. Images were processed with CAD. IMPRESSION: Further evaluation is suggested for possible asymmetry in the right breast. RECOMMENDATION: Diagnostic mammogram and possibly ultrasound of the right breast. (Code:[id number]) The patient will be contacted regarding the findings, and additional imaging will be scheduled. BI-RADS CATEGORY  0: Incomplete. Need additional imaging evaluation and/or prior mammograms for comparison. Electronically Signed   By: Lovey Newcomer M.D.   On: 05/03/2015 15:55   US Breast Ltd Uni Right Inc Axilla  05/10/2015  CLINICAL DATA:  Possible asymmetry in the lateral right breast on a recent 2D screening mammogram. EXAM: DIGITAL DIAGNOSTIC RIGHT MAMMOGRAM WITH 3D TOMOSYNTHESIS WITH CAD ULTRASOUND RIGHT BREAST COMPARISON:  Previous exam(s). ACR Breast Density Category c: The breast tissue is heterogeneously dense, which may obscure small masses. FINDINGS: 3D tomographic images of the right breast  confirm an oval, circumscribed mass in the posterior aspect of the lateral portion of the breast, in approximately 9-10 o'clock position. There are multiple smaller, similar-appearing masses. Mammographic images were processed with CAD. On physical exam, no mass is palpable in the outer right breast. Targeted ultrasound is performed, showing multiple cysts in the outer right breast. These include a 1.2 cm cyst in the posterior aspect of the 9:30 o'clock position, corresponding to the mammographic mass. There is also a 1.3 cm cyst containing some internal echoes and thin internal septations in the 10 o'clock position, 5 cm from the nipple. There are multiple additional smaller cysts. IMPRESSION: Multiple right breast cysts.  No evidence of malignancy. RECOMMENDATION: Bilateral screening mammogram in 1 year. I have discussed the findings and recommendations with the patient. Results were also provided in writing at the conclusion of the visit. If applicable, a reminder letter will be sent to the patient regarding the next appointment. BI-RADS CATEGORY  2: Benign. Electronically Signed   By: Claudie Revering M.D.   On: 05/10/2015 19:29   Mm Diag Breast Tomo Uni Right  05/10/2015  CLINICAL DATA:  Possible asymmetry in the lateral right breast on a recent 2D screening mammogram. EXAM: DIGITAL DIAGNOSTIC RIGHT MAMMOGRAM WITH 3D TOMOSYNTHESIS WITH CAD ULTRASOUND RIGHT BREAST COMPARISON:  Previous exam(s). ACR Breast Density Category c: The breast tissue is heterogeneously dense, which may obscure small masses. FINDINGS: 3D tomographic images of the right breast confirm an oval, circumscribed mass in the posterior aspect of the lateral portion of the breast, in approximately 9-10 o'clock position. There are multiple smaller, similar-appearing masses. Mammographic images were processed with CAD. On physical exam, no mass is palpable in the outer right breast. Targeted ultrasound is performed, showing multiple cysts in the  outer right breast. These include a 1.2 cm cyst in the posterior aspect of the 9:30 o'clock position, corresponding to the mammographic mass. There is also a 1.3 cm cyst containing some internal echoes and thin internal septations in the 10 o'clock position, 5 cm from the nipple. There are multiple additional smaller cysts. IMPRESSION: Multiple right breast cysts.  No evidence of malignancy. RECOMMENDATION: Bilateral screening mammogram in 1 year. I have discussed the findings and recommendations with the patient. Results were also provided in writing at the conclusion of the visit. If applicable, a reminder letter will be sent to the patient regarding the next appointment. BI-RADS CATEGORY  2: Benign. Electronically Signed   By: Remo Lipps  Joneen Caraway M.D.   On: 05/10/2015 19:29    PHYSICAL EXAM General: NAD Neck: No JVD, no thyromegaly or thyroid nodule.  Lungs: Clear to auscultation bilaterally with normal respiratory effort. CV: Nondisplaced PMI.  Heart regular S1/S2, no S3/S4, no murmur.  No peripheral edema.   Abdomen: Soft, nontender, no hepatosplenomegaly, no distention.  Neurologic: Alert and oriented x 3.  Psych: Normal affect. Extremities: No clubbing or cyanosis. Right groin cath site benign.    TELEMETRY: Reviewed telemetry pt in NSR  ASSESSMENT AND PLAN: 60 yo with history of HTN and strong family history of CAD presented with anterior STEMI, had DES to proximal LAD.  Shortly afterwards, she had re-occlusion of proximal LAD with repeat DES to pLAD.  1. CAD: s/p anterior STEMI with DES pLAD, followed by re-occlusion and repeat DES to pLAD. No further chest pain.  Echo showed EF 55-60%.  - Continue ASA 81, ticagrelor, atorvastatin 80 daily.  - Continue Coreg - Continue lisinopril 5 daily.   2. HTN: BP controlled.  3. Walk with cardiac rehab, may go to telemetry.  Home tomorrow.   Loralie Champagne 05/30/2015 9:48 AM

## 2015-05-31 ENCOUNTER — Encounter (HOSPITAL_COMMUNITY): Payer: Self-pay | Admitting: Cardiology

## 2015-05-31 DIAGNOSIS — E785 Hyperlipidemia, unspecified: Secondary | ICD-10-CM

## 2015-05-31 LAB — BASIC METABOLIC PANEL
Anion gap: 8 (ref 5–15)
BUN: 11 mg/dL (ref 6–20)
CHLORIDE: 105 mmol/L (ref 101–111)
CO2: 26 mmol/L (ref 22–32)
Calcium: 8.8 mg/dL — ABNORMAL LOW (ref 8.9–10.3)
Creatinine, Ser: 0.89 mg/dL (ref 0.44–1.00)
GFR calc Af Amer: >60 mL/min (ref >60)
GFR calc non Af Amer: >60 mL/min (ref >60)
Glucose, Bld: 149 mg/dL — ABNORMAL HIGH (ref 65–99)
POTASSIUM: 4.1 mmol/L (ref 3.5–5.1)
SODIUM: 139 mmol/L (ref 135–145)

## 2015-05-31 LAB — CBC
HCT: 39.9 % (ref 36.0–46.0)
HEMOGLOBIN: 12.8 g/dL (ref 12.0–15.0)
MCH: 29.6 pg (ref 26.0–34.0)
MCHC: 32.1 g/dL (ref 30.0–36.0)
MCV: 92.4 fL (ref 78.0–100.0)
Platelets: 248 10*3/uL (ref 150–400)
RBC: 4.32 MIL/uL (ref 3.87–5.11)
RDW: 13.1 % (ref 11.5–15.5)
WBC: 10.4 10*3/uL (ref 4.0–10.5)

## 2015-05-31 MED ORDER — NITROGLYCERIN 0.4 MG SL SUBL: 0.4000 mg | SUBLINGUAL_TABLET | SUBLINGUAL | Status: AC | PRN

## 2015-05-31 MED ORDER — TICAGRELOR 90 MG PO TABS: 90.0000 mg | ORAL_TABLET | Freq: Two times a day (BID) | ORAL | Status: DC

## 2015-05-31 MED ORDER — ASPIRIN 81 MG PO CHEW: 81.0000 mg | CHEWABLE_TABLET | Freq: Every day | ORAL | Status: AC

## 2015-05-31 MED ORDER — CARVEDILOL 3.125 MG PO TABS: 3.1250 mg | ORAL_TABLET | Freq: Two times a day (BID) | ORAL | Status: DC

## 2015-05-31 MED ORDER — ATORVASTATIN CALCIUM 80 MG PO TABS
80.0000 mg | ORAL_TABLET | Freq: Every day | ORAL | Status: DC
Start: 2015-05-31 — End: 2015-08-16

## 2015-05-31 MED ORDER — LISINOPRIL 5 MG PO TABS: 5.0000 mg | ORAL_TABLET | Freq: Every day | ORAL | Status: DC

## 2015-05-31 MED FILL — Verapamil HCl IV Soln 2.5 MG/ML: INTRAVENOUS | Qty: 2 | Status: AC

## 2015-05-31 NOTE — Care Management Note (Addendum)
Case Management Note  Patient Details  Name: Brenda Guerrero MRN: [id number] Date of Birth: 1955/06/26  Subjective/Objective:      STEMI, HTN              Action/Plan:  NCM spoke to pt and husband, [name] at bedside. Pt states she is [employer] employee but her insurance is through her husband's plan. Her medication usually run $20 per month. Provided pt with a Brilinta 30 day free trial card and copay card. Explained to pt that medication should be taken as prescribed and side effects reported to her physician. Benefits check sent. Waiting results.    PCP -see NP, Healther Junius Creamer at [place],,  Dr Clayborn Bigness office   S/W MERCEDES @ [facility] # [phone]   BRILINTA 90 MG BID   COVER- YES  CO-PAY- $ 25.00  PRIOR APPROVAL - NO  [facility]       Expected Discharge Date:  05/31/2015              Expected Discharge Plan:  Home/Self Care  In-House Referral:  NA  Discharge planning Services  CM Consult, Medication Assistance  Post Acute Care Choice:  NA Choice offered to:  NA  DME Arranged:  N/A DME Agency:  NA  HH Arranged:  NA HH Agency:  NA  Status of Service:  Completed, signed off  Medicare Important Message Given:    Date Medicare IM Given:    Medicare IM give by:    Date Additional Medicare IM Given:    Additional Medicare Important Message give by:     If discussed at [place] of Stay Meetings, dates discussed:    Additional Comments:  Erenest Rasher, RN 05/31/2015, 1:21 PM

## 2015-05-31 NOTE — Discharge Summary (Signed)
Discharge Summary    Patient ID: Brenda Guerrero,  MRN: [id number], DOB/AGE: Oct 30, 1955 60 y.o.  Admit date: 05/28/2015 Discharge date: 05/31/2015  Primary Care Provider: No PCP Per Patient Primary Cardiologist: Dr. Martinique  Discharge Diagnoses    Principal Problem:   ST elevation (STEMI) myocardial infarction involving left anterior descending coronary artery [facility]) Active Problems:   HTN (hypertension)   Prediabetes   Allergies No Known Allergies  Diagnostic Studies/Procedures    Initial LHC 05/28/15 Procedures    Coronary Stent Intervention   Left Heart Cath and Coronary Angiography    Conclusion     Prox RCA to Mid RCA lesion, 10% stenosed.  The left ventricular systolic function is normal.  Ost LAD to Prox LAD lesion, 100% stenosed. Post intervention, there is a 0% residual stenosis.  1. Single vessel obstructive CAD 2. Normal LV function 3. Successful stenting of the proximal LAD with DES.     Repeat LHC 05/28/15 Procedures    Coronary Stent Intervention   Intravascular Ultrasound/IVUS   Coronary/Graft Angiography    Conclusion     Prox LAD to Mid LAD lesion, 100% stenosed. Post intervention, there is a 0% residual stenosis. The lesion was previously treated with a drug-eluting stent less than forty-eight hours ago.  Successful repeat stenting of the LAD and PTCA for acute stent thrombosis.      2D echo 05/29/15 Study Conclusions  - Left ventricle: The cavity size was normal. Wall thickness was normal. Systolic function was normal. The estimated ejection fraction was in the range of 55% to 60%. Wall motion was normal; there were no regional wall motion abnormalities. Doppler parameters are consistent with abnormal left ventricular relaxation (grade 1 diastolic dysfunction).  Impressions:  - Normal LV systolic function; grade 1 diastolic dysfunction; trace MR and TR. _____________   History of Present Illness       60 y.o. female with a history of obesity, HTN, HLD, pre-diabetes, asthma and a family hx of CAD who presented to [facility] on 05/28/15 as an anterior STEMI.   She initially developed chest pain radiating down her back and arm. She felt mildly SOB, nauseated and very diaphoretic. She called EMS and ECG showed ST elevation in V3-V6. She was brought emergently to [facility] as a CODE STEMI. In cath lab she is having 10/10 pain.   Hospital Course     She was taken to the [facility] cath lab for urgent revascularization. The procedure was performed by Dr. Martinique. She was found to have single vessel obstructive CAD with 100% occlusion of the proximal LAD. She underwent PCI +DES to the proximal LAD. There was mild 10% narrowing of the proximal to distal RCA but no other significant disease. LVF was normal by cath with EF at 55-65%. She left the cath lab in stable condition and was placed on DAPT.   Within 30 minutes of arrival in the ICU the patient had marked increase in chest pain and hyperacute ST elevation consistent with reocclusion. She was taken back to the cath lab and found to have reocclusion of the vessel. She underwent repeat stenting of the LAD and PTCA for acute stent thrombosis. Her pain resolved post revascularization. She left the cath lab in stable condition. She was continued on IV heparin overnight as well as Aggrastat for 18 hrs. She was also placed on DAPT with ASA + Brilinta, high dose statin therapy with Lipitor, Coreg and lisinopril. She had no recurrent CP and no further complications. Her  cath site and renal function remained stable. Her vital signs also remained stable. 2D echo was obtained revealing normal LVEF of 55-60% with normal wall motion. She had no difficulties ambulating with cardiac rehab. She was last seen and examined by Dr. Oval Linsey who determined shew was stable for discharge home. She will f/u in 1 week with Cecilie Kicks, NP. She will be followed long term by Dr. Martinique.   Consultants:  none    Discharge Vitals Blood pressure 106/68, pulse 77, temperature 97.5 F (36.4 C), temperature source Oral, resp. rate 18, height 5\' 4"  (1.626 m), weight 182 lb 11.2 oz (82.872 kg), SpO2 98 %.  Filed Weights   05/28/15 1850 05/28/15 1900 05/31/15 0526  Weight: 185 lb 13.6 oz (84.3 kg) 185 lb (83.915 kg) 182 lb 11.2 oz (82.872 kg)    Labs & Radiologic Studies     CBC  Recent Labs  05/30/15 0545 05/31/15 0405  WBC 9.3 10.4  HGB 13.5 12.8  HCT 42.3 39.9  MCV 93.0 92.4  PLT 245 [id number]   Basic Metabolic Panel  Recent Labs  05/30/15 0954 05/31/15 0405  NA 138 139  K 3.5 4.1  CL 102 105  CO2 24 26  GLUCOSE 201* 149*  BUN 11 11  CREATININE 0.77 0.89  CALCIUM 9.0 8.8*   Liver Function Tests  Recent Labs  05/28/15 1740  AST 25  ALT 31  ALKPHOS 121  BILITOT 0.3  PROT 6.7  ALBUMIN 3.4*   No results for input(s): LIPASE, AMYLASE in the last 72 hours. Cardiac Enzymes  Recent Labs  05/28/15 1740 05/28/15 2308 05/29/15 0430 05/29/15 1117  CKTOTAL 136  --   --   --   CKMB 4.3  --   --   --   TROPONINI 0.03 1.02* 3.71* 4.38*   BNP Invalid input(s): POCBNP D-Dimer No results for input(s): DDIMER in the last 72 hours. Hemoglobin A1C  Recent Labs  05/28/15 1740  HGBA1C 7.2*   Fasting Lipid Panel  Recent Labs  05/28/15 1740  CHOL 176  HDL 26*  LDLCALC 109*  TRIG 204*  CHOLHDL 6.8   Thyroid Function Tests No results for input(s): TSH, T4TOTAL, T3FREE, THYROIDAB in the last 72 hours.  Invalid input(s): FREET3  Dg Bone Density  05/03/2015  EXAM: DUAL X-RAY ABSORPTIOMETRY (DXA) FOR BONE MINERAL DENSITY IMPRESSION: Dear Dr. Leretha Pol, Your patient JOHNESHIA ARK completed a FRAX assessment on 05/03/2015 using the [place] (analysis version: 14.10) manufactured by [employer]. The following summarizes the results of our evaluation. PATIENT BIOGRAPHICAL: Name: Clois, Cola Patient ID: [id number] Birth Date: [birth year]  Height:    61.3 in. Gender:     Female    Age:        33.1       Weight:    187.4 lbs. Ethnicity:  White                            Exam Date: 05/03/2015 FRAX* RESULTS:  (version: 3.5) 10-year Probability of Fracture1 Major Osteoporotic Fracture2 Hip Fracture 12.8% 1.3% Population: [place] (Caucasian) Risk Factors: History of Fracture (Adult) Based on Femur (Left) Neck BMD 1 -The 10-year probability of fracture may be lower than reported if the patient has received treatment. 2 -Major Osteoporotic Fracture: Clinical Spine, Forearm, Hip or Shoulder *FRAX is a Materials engineer of the [employer] for Metabolic Bone Disease, a Lexicographer (  WHO) [place]. ASSESSMENT: The probability of a major osteoporotic fracture is 12.8% within the next ten years. The probability of a hip fracture is 1.3% within the next ten years. . Dear Dr. Leretha Pol, Your patient Cozy Eads completed a BMD test on 05/03/2015 using the [place] (analysis version: 14.10) manufactured by [employer]. The following summarizes the results of our evaluation. PATIENT BIOGRAPHICAL: Name: Kadie, Seiser Patient ID: [id number] Birth Date: February 28, 1956 Height: 61.3 in. Gender: Female Exam Date: 05/03/2015 Weight: 187.4 lbs. Indications: Caucasian, History of Fracture (Adult), Postmenopausal Fractures: Foot Treatments: CALCIUM VIT D, Multi-Vitamin with calcium ASSESSMENT: The BMD measured at Femur Neck Left is 0.796 g/cm2 with a T-score of -1.7. This patient is considered osteopenic according to [facility]) criteria. Site Region Measured Measured WHO Young Adult BMD Date       Age      Classification T-score AP Spine L1-L4 05/03/2015 57.1 Osteopenia -1.3 1.033 g/cm2 DualFemur Neck Left 05/03/2015 57.1 Osteopenia -1.7 0.796 g/cm2 World Health Organization [facility]) criteria for post-menopausal, Caucasian Women: Normal:       T-score at or above -1 SD Osteopenia:    T-score between -1 and -2.5 SD Osteoporosis: T-score at or below -2.5 SD RECOMMENDATIONS: [place] recommends that FDA-approved medical therapies be considered in postmenopausal women and men age 81 or older with a: 1. Hip or vertebral (clinical or morphometric) fracture. 2. T-score of < -2.5 at the spine or hip. 3. Ten-year fracture probability by FRAX of 3% or greater for hip fracture or 20% or greater for major osteoporotic fracture. All treatment decisions require clinical judgment and consideration of individual patient factors, including patient preferences, co-morbidities, previous drug use, risk factors not captured in the FRAX model (e.g. falls, vitamin D deficiency, increased bone turnover, interval significant decline in bone density) and possible under - or over-estimation of fracture risk by FRAX. All patients should ensure an adequate intake of dietary calcium (1200 mg/d) and vitamin D (800 IU daily) unless contraindicated. FOLLOW-UP: People with diagnosed cases of osteoporosis or at high risk for fracture should have regular bone mineral density tests. For patients eligible for Medicare, routine testing is allowed once every 2 years. The testing frequency can be increased to one year for patients who have rapidly progressing disease, those who are receiving or discontinuing medical therapy to restore bone mass, or have additional risk factors. I have reviewed this report, and agree with the above findings. [facility] Electronically Signed   By: Lahoma Crocker M.D.   On: 05/03/2015 14:35   Mm Digital Screening Bilateral  05/03/2015  CLINICAL DATA:  Screening. EXAM: DIGITAL SCREENING BILATERAL MAMMOGRAM WITH CAD COMPARISON:  Previous exam(s). ACR Breast Density Category c: The breast tissue is heterogeneously dense, which may obscure small masses. FINDINGS: In the right breast, a possible asymmetry warrants further evaluation. In the left breast, no findings suspicious  for malignancy. Images were processed with CAD. IMPRESSION: Further evaluation is suggested for possible asymmetry in the right breast. RECOMMENDATION: Diagnostic mammogram and possibly ultrasound of the right breast. (Code:[id number]) The patient will be contacted regarding the findings, and additional imaging will be scheduled. BI-RADS CATEGORY  0: Incomplete. Need additional imaging evaluation and/or prior mammograms for comparison. Electronically Signed   By: Lovey Newcomer M.D.   On: 05/03/2015 15:55   US Breast Ltd Uni Right Inc Axilla  05/10/2015  CLINICAL DATA:  Possible asymmetry in the lateral right breast on a recent 2D screening mammogram. EXAM:  DIGITAL DIAGNOSTIC RIGHT MAMMOGRAM WITH 3D TOMOSYNTHESIS WITH CAD ULTRASOUND RIGHT BREAST COMPARISON:  Previous exam(s). ACR Breast Density Category c: The breast tissue is heterogeneously dense, which may obscure small masses. FINDINGS: 3D tomographic images of the right breast confirm an oval, circumscribed mass in the posterior aspect of the lateral portion of the breast, in approximately 9-10 o'clock position. There are multiple smaller, similar-appearing masses. Mammographic images were processed with CAD. On physical exam, no mass is palpable in the outer right breast. Targeted ultrasound is performed, showing multiple cysts in the outer right breast. These include a 1.2 cm cyst in the posterior aspect of the 9:30 o'clock position, corresponding to the mammographic mass. There is also a 1.3 cm cyst containing some internal echoes and thin internal septations in the 10 o'clock position, 5 cm from the nipple. There are multiple additional smaller cysts. IMPRESSION: Multiple right breast cysts.  No evidence of malignancy. RECOMMENDATION: Bilateral screening mammogram in 1 year. I have discussed the findings and recommendations with the patient. Results were also provided in writing at the conclusion of the visit. If applicable, a reminder letter will be sent to  the patient regarding the next appointment. BI-RADS CATEGORY  2: Benign. Electronically Signed   By: Claudie Revering M.D.   On: 05/10/2015 19:29   Mm Diag Breast Tomo Uni Right  05/10/2015  CLINICAL DATA:  Possible asymmetry in the lateral right breast on a recent 2D screening mammogram. EXAM: DIGITAL DIAGNOSTIC RIGHT MAMMOGRAM WITH 3D TOMOSYNTHESIS WITH CAD ULTRASOUND RIGHT BREAST COMPARISON:  Previous exam(s). ACR Breast Density Category c: The breast tissue is heterogeneously dense, which may obscure small masses. FINDINGS: 3D tomographic images of the right breast confirm an oval, circumscribed mass in the posterior aspect of the lateral portion of the breast, in approximately 9-10 o'clock position. There are multiple smaller, similar-appearing masses. Mammographic images were processed with CAD. On physical exam, no mass is palpable in the outer right breast. Targeted ultrasound is performed, showing multiple cysts in the outer right breast. These include a 1.2 cm cyst in the posterior aspect of the 9:30 o'clock position, corresponding to the mammographic mass. There is also a 1.3 cm cyst containing some internal echoes and thin internal septations in the 10 o'clock position, 5 cm from the nipple. There are multiple additional smaller cysts. IMPRESSION: Multiple right breast cysts.  No evidence of malignancy. RECOMMENDATION: Bilateral screening mammogram in 1 year. I have discussed the findings and recommendations with the patient. Results were also provided in writing at the conclusion of the visit. If applicable, a reminder letter will be sent to the patient regarding the next appointment. BI-RADS CATEGORY  2: Benign. Electronically Signed   By: Claudie Revering M.D.   On: 05/10/2015 19:29    Disposition   Pt is being discharged home today in good condition.  Follow-up Plans & Appointments    Follow-up Information    Follow up with [facility] On 06/07/2015.   Specialty:  Cardiology    Why:  with Cecilie Kicks, NP (Dr. Doug Sou PA) 2:00 PM   Contact information:   [address] [place] [phone]     Discharge Instructions    AMB Referral to Cardiac Rehabilitation - Phase II    Complete by:  As directed   Diagnosis:  Myocardial Infarction     Amb Referral to Cardiac Rehabilitation    Complete by:  As directed   Diagnosis:   Myocardial Infarction PCI  Diet - low sodium heart healthy    Complete by:  As directed      Driving Restrictions    Complete by:  As directed   No driving until your follow-up visit     Increase activity slowly    Complete by:  As directed      Lifting restrictions    Complete by:  As directed   No heavy lifting for 2 weeks     Sexual Activity Restrictions    Complete by:  As directed   No sexual activity for 2 weeks           Discharge Medications   Current Discharge Medication List    START taking these medications   Details  aspirin 81 MG chewable tablet Chew 1 tablet (81 mg total) by mouth daily.    atorvastatin (LIPITOR) 80 MG tablet Take 1 tablet (80 mg total) by mouth daily at 6 PM. Qty: 30 tablet, Refills: 5    carvedilol (COREG) 3.125 MG tablet Take 1 tablet (3.125 mg total) by mouth 2 (two) times daily with a meal. Qty: 60 tablet, Refills: 5    nitroGLYCERIN (NITROSTAT) 0.4 MG SL tablet Place 1 tablet (0.4 mg total) under the tongue every 5 (five) minutes as needed for chest pain. Qty: 25 tablet, Refills: 2    !! ticagrelor (BRILINTA) 90 MG TABS tablet Take 1 tablet (90 mg total) by mouth 2 (two) times daily. Qty: 60 tablet, Refills: 10    !! ticagrelor (BRILINTA) 90 MG TABS tablet Take 1 tablet (90 mg total) by mouth 2 (two) times daily. Qty: 60 tablet, Refills: 0     !! - Potential duplicate medications found. Please discuss with provider.    CONTINUE these medications which have CHANGED   Details  lisinopril (PRINIVIL,ZESTRIL) 5 MG tablet Take 1 tablet (5 mg  total) by mouth daily. Qty: 30 tablet, Refills: 5      CONTINUE these medications which have NOT CHANGED   Details  calcium carbonate (OS-CAL) 1250 (500 Ca) MG chewable tablet Chew 1 tablet by mouth daily.    cholecalciferol (VITAMIN D) 1000 units tablet Take 1,000 Units by mouth daily.    fexofenadine-pseudoephedrine (ALLEGRA-D 24) 180-240 MG 24 hr tablet Take 1 tablet by mouth daily.    fluticasone (FLONASE) 50 MCG/ACT nasal spray Place 1 spray into both nostrils daily.      STOP taking these medications     ibuprofen (ADVIL,MOTRIN) 800 MG tablet          Aspirin prescribed at discharge?  Yes High Intensity Statin Prescribed? (Lipitor 40-80mg  or Crestor 20-40mg ): Yes Beta Blocker Prescribed? Yes For EF 45% or less, Was ACEI/ARB Prescribed? Yes ADP Receptor Inhibitor Prescribed? (i.e. Plavix etc.-Includes Medically Managed Patients): Yes For EF <40%, Aldosterone Inhibitor Prescribed? No: EF >40% Was EF assessed during THIS hospitalization? Yes Was Cardiac Rehab II ordered? (Included Medically managed Patients): No: will order at time of post hospital f/u   Outstanding Labs/Studies   none  Duration of Discharge Encounter   Greater than 30 minutes including physician time.  Alveta Heimlich, Zynia Wojtowicz NP 05/31/2015, 12:45 PM

## 2015-05-31 NOTE — Progress Notes (Signed)
Patient Profile: 60 y/o female admitted for ACS. Anterior MI 2/10 with DES to pLAD. A couple hours later she re-occluded LAD and had repeat DES to pLAD.   Echo with EF 55-60%.    Subjective: No complaints. Denies recurrent CP. No dyspnea. Ambulating w/o difficulty.   Objective: Vital signs in last 24 hours: Temp:  [97.5 F (36.4 C)-98.7 F (37.1 C)] 97.5 F (36.4 C) (02/13 0526) Pulse Rate:  [70-87] 77 (02/13 0526) Resp:  [14-19] 18 (02/12 1100) BP: (106-119)/(64-83) 106/68 mmHg (02/13 0526) SpO2:  [95 %-98 %] 98 % (02/13 0526) Weight:  [182 lb 11.2 oz (82.872 kg)] 182 lb 11.2 oz (82.872 kg) (02/13 0526)    Intake/Output from previous day: 02/12 0701 - 02/13 0700 In: 360 [P.O.:360] Out: 300 [Urine:300] Intake/Output this shift:    Medications Current Facility-Administered Medications  Medication Dose Route Frequency Provider Last Rate Last Dose  . 0.9 %  sodium chloride infusion  250 mL Intravenous PRN Peter M Martinique, MD      . 0.9 %  sodium chloride infusion  250 mL Intravenous PRN Peter M Martinique, MD      . acetaminophen (TYLENOL) tablet 650 mg  650 mg Oral Q4H PRN Peter M Martinique, MD   650 mg at 05/30/15 1856  . aspirin chewable tablet 81 mg  81 mg Oral Daily Peter M Martinique, MD   81 mg at 05/30/15 0915  . atorvastatin (LIPITOR) tablet 80 mg  80 mg Oral q1800 Peter M Martinique, MD   80 mg at 05/30/15 1657  . carvedilol (COREG) tablet 3.125 mg  3.125 mg Oral BID WC Peter M Martinique, MD   3.125 mg at 05/30/15 1657  . lisinopril (PRINIVIL,ZESTRIL) tablet 5 mg  5 mg Oral Daily Larey Dresser, MD   5 mg at 05/30/15 0915  . morphine 2 MG/ML injection 2 mg  2 mg Intravenous Q1H PRN Eileen Stanford, PA-C   2 mg at 05/28/15 1926  . nitroGLYCERIN 50 mg in dextrose 5 % 250 mL (0.2 mg/mL) infusion  0-200 mcg/min Intravenous Titrated Eileen Stanford, PA-C 1.5 mL/hr at 05/29/15 0600 5 mcg/min at 05/29/15 0600  . ondansetron (ZOFRAN) injection 4 mg  4 mg Intravenous Q6H PRN Peter  M Martinique, MD   4 mg at 05/28/15 1848  . sodium chloride flush (NS) 0.9 % injection 3 mL  3 mL Intravenous Q12H Peter M Martinique, MD   3 mL at 05/30/15 1000  . sodium chloride flush (NS) 0.9 % injection 3 mL  3 mL Intravenous PRN Peter M Martinique, MD      . sodium chloride flush (NS) 0.9 % injection 3 mL  3 mL Intravenous Q12H Peter M Martinique, MD   3 mL at 05/30/15 2047  . sodium chloride flush (NS) 0.9 % injection 3 mL  3 mL Intravenous PRN Peter M Martinique, MD      . ticagrelor [facility]) tablet 90 mg  90 mg Oral BID Peter M Martinique, MD   90 mg at 05/30/15 2047    PE: General appearance: alert, cooperative and no distress Neck: no carotid bruit and no JVD Lungs: clear to auscultation bilaterally Heart: regular rate and rhythm, S1, S2 normal, no murmur, click, rub or gallop Extremities: no LEE Pulses: 2+ and symmetric Skin: warm and dry Neurologic: Grossly normal  Lab Results:   Recent Labs  05/29/15 0235 05/30/15 0545 05/31/15 0405  WBC 12.7* 9.3 10.4  HGB 12.2 13.5 12.8  HCT  37.7 42.3 39.9  PLT 273 245 248   BMET  Recent Labs  05/29/15 0430 05/30/15 0954 05/31/15 0405  NA 138 138 139  K 3.8 3.5 4.1  CL 104 102 105  CO2 24 24 26   GLUCOSE 170* 201* 149*  BUN 8 11 11   CREATININE 0.48 0.77 0.89  CALCIUM 8.3* 9.0 8.8*   PT/INR  Recent Labs  05/28/15 1908  LABPROT 18.3*  INR 1.52*    Cardiac Panel (last 3 results)  Recent Labs  05/28/15 1740 05/28/15 2308 05/29/15 0430 05/29/15 1117  CKTOTAL 136  --   --   --   CKMB 4.3  --   --   --   TROPONINI 0.03 1.02* 3.71* 4.38*  RELINDX 3.2*  --   --   --     Assessment/Plan  Principal Problem:   ST elevation (STEMI) myocardial infarction involving left anterior descending coronary artery (HCC) Active Problems:   HTN (hypertension)   Prediabetes   60 yo with history of HTN and strong family history of CAD presented with anterior STEMI, had DES to proximal LAD. Shortly afterwards, she had re-occlusion of  proximal LAD with repeat DES to pLAD.   1. CAD: s/p anterior STEMI with DES pLAD, followed by re-occlusion and repeat DES to pLAD. No further chest pain or dyspnea. Ambulating w/o difficulty.  Echo showed EF of 55-60%. Continue DAPT with ASA + Brilinta, high intensity statin, BB and ACE-I.   2. HTN: BP is well controlled on Coreg and lisinopril.    Dispo: d/c home later today.    LOS: 3 days    Brittainy M. Ladoris Gene 05/31/2015 7:44 AM

## 2015-05-31 NOTE — Progress Notes (Signed)
CARDIAC REHAB PHASE I   PRE:  Rate/Rhythm: 99 SR    BP: sitting 129/72    SaO2:   MODE:  Ambulation: 550 ft   POST:  Rate/Rhythm: 109 ST    BP: sitting 131/77     SaO2:   Tolerated well, feels good. Ed completed with good reception. Understands importance of Brilinta, would benefit from insurance being checked. Will send referral to [facility]. Pt very motivated. Discussed elevated A1C. Pt plans to work on diet and ex and f/u with PCP in 3 months or less.  [id number]  Brenda Guerrero [place] CES, ACSM 05/31/2015 9:24 AM

## 2015-06-01 ENCOUNTER — Telehealth: Payer: Self-pay | Admitting: Cardiology

## 2015-06-01 NOTE — Telephone Encounter (Signed)
Received [employer] FMLA forms via fax.  No AUTH/PMT received.  Sent to [facility] [place] for letter to be sent to patient for AUTH/PMT.  Sent to [facility] via [place] 06/01/15. lp

## 2015-06-06 NOTE — Progress Notes (Signed)
Cardiology Office Note   Date:  06/07/2015   ID:  Siah, [place] Sep 13, 1955, MRN [id number]  PCP:  Ronnell Freshwater, NP  Cardiologist:  Dr. Martinique    Chief Complaint  Patient presents with  . Hospitalization Follow-up    Patient has no complaints.  after STEMI      History of Present Illness: Brenda Guerrero is a 60 y.o. female who presents for post hospitalization for STEMI, LAD.  Admitted 03/26/16.    She has a history of obesity, HTN, HLD, pre-diabetes, asthma and a family hx of CAD who presented to [facility] on 05/28/15 as an anterior STEMI.  Underwent emergent cath and found to have single vessel obstructive CAD, DES to LAD.  Pk troponin 4  30 minutes of arrival in the ICU the patient had marked increase in chest pain and hyperacute ST elevation consistent with reocclusion. She was taken back to the cath lab and found to have reocclusion of the vessel. She underwent repeat stenting of the LAD and PTCA for acute stent thrombosis. Her pain resolved post revascularization. IV heparin overnight as well as Aggrastat for 18 hrs. She was also placed on DAPT with ASA + Brilinta, high dose statin therapy with Lipitor, Coreg and lisinopril. She did well   She was seen by cardiac rehab.    Today she feels great, no chest pain or SOB. She has been taking medications correctly.  She has changed her diet and is exercising.  She does not want to cardiac rehab.  She is walking now and plans to go to zumba in the future once she has recovered.  Plans to go back to work next week. Which is reasonable.  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      Past Medical History  Diagnosis Date  . Asthma   . Hypertension   . ST elevation (STEMI) myocardial infarction involving left anterior descending coronary artery ([place]) 05/28/15    with DES to LAD and return to cath lab for restenosis requiring PTCA  . Hyperlipidemia LDL goal <70   . CAD in native artery     Past Surgical History    Procedure Laterality Date  . Tubal ligation    . Cardiac catheterization N/A 05/28/2015    Procedure: Left Heart Cath and Coronary Angiography;  Surgeon: Peter M Martinique, MD;  Location: [place] [facility];  Service: Cardiovascular;  Laterality: N/A;  . Cardiac catheterization N/A 05/28/2015    Procedure: Coronary Stent Intervention;  Surgeon: Peter M Martinique, MD;  Location: [place] [facility];  Service: Cardiovascular;  Laterality: N/A;  . Cardiac catheterization N/A 05/28/2015    Procedure: Coronary Stent Intervention;  Surgeon: Peter M Martinique, MD;  Location: [place] [facility];  Service: Cardiovascular;  Laterality: N/A;  . Cardiac catheterization N/A 05/28/2015    Procedure: Coronary/Graft Angiography;  Surgeon: Peter M Martinique, MD;  Location: [place] [facility];  Service: Cardiovascular;  Laterality: N/A;  . Cardiac catheterization N/A 05/28/2015    Procedure: Intravascular Ultrasound/IVUS;  Surgeon: Peter M Martinique, MD;  Location: [place] [facility];  Service: Cardiovascular;  Laterality: N/A;     Current Outpatient Prescriptions  Medication Sig Dispense Refill  . aspirin 81 MG chewable tablet Chew 1 tablet (81 mg total) by mouth daily.    Marland Kitchen atorvastatin (LIPITOR) 80 MG tablet Take 1 tablet (80 mg total) by mouth daily at 6 PM. 30 tablet 5  . calcium carbonate (OS-CAL) 1250 (500 Ca)  MG chewable tablet Chew 1 tablet by mouth daily.    . carvedilol (COREG) 3.125 MG tablet Take 1 tablet (3.125 mg total) by mouth 2 (two) times daily with a meal. 60 tablet 5  . cholecalciferol (VITAMIN D) 1000 units tablet Take 1,000 Units by mouth daily.    . fexofenadine-pseudoephedrine (ALLEGRA-D 24) 180-240 MG 24 hr tablet Take 1 tablet by mouth daily.    . fluticasone (FLONASE) 50 MCG/ACT nasal spray Place 1 spray into both nostrils daily.    Marland Kitchen lisinopril (PRINIVIL,ZESTRIL) 5 MG tablet Take 1 tablet (5 mg total) by mouth daily. 30 tablet 5  . nitroGLYCERIN (NITROSTAT) 0.4 MG SL tablet Place 1 tablet (0.4 mg  total) under the tongue every 5 (five) minutes as needed for chest pain. 25 tablet 2  . ticagrelor (BRILINTA) 90 MG TABS tablet Take 1 tablet (90 mg total) by mouth 2 (two) times daily. 60 tablet 10  . ticagrelor (BRILINTA) 90 MG TABS tablet Take 1 tablet (90 mg total) by mouth 2 (two) times daily. 60 tablet 0   No current facility-administered medications for this visit.    Allergies:   Review of patient's allergies indicates no known allergies.    Social History:  The patient  reports that she has never smoked. She has never used smokeless tobacco. She reports that she does not use illicit drugs.   Family History:  The patient's   family history includes Heart attack (age of onset: 86) in her brother; Heart disease in her father.    ROS:  General:no colds or fevers, no weight changes Skin:no rashes or ulcers HEENT:no blurred vision, no congestion CV:see HPI PUL:see HPI GI:no diarrhea constipation or melena, no indigestion GU:no hematuria, no dysuria MS:no joint pain, no claudication Neuro:no syncope, no lightheadedness Endo:no diabetes, no thyroid disease  Wt Readings from Last 3 Encounters:  06/07/15 181 lb (82.101 kg)  05/31/15 182 lb 11.2 oz (82.872 kg)  04/19/15 175 lb (79.379 kg)     PHYSICAL EXAM: VS:  BP 104/72 mmHg  Pulse 71  Ht 5\' 2"  (1.575 m)  Wt 181 lb (82.101 kg)  BMI 33.10 kg/m2 , BMI Body mass index is 33.1 kg/(m^2). General:Pleasant affect, NAD Skin:Warm and dry, brisk capillary refill HEENT:normocephalic, sclera clear, mucus membranes moist Neck:supple, no JVD, no bruits  Heart:S1S2 RRR without murmur, gallup, rub or click Lungs:clear without rales, rhonchi, or wheezes [id number], non tender, + BS, do not palpate liver spleen or masses Ext:no lower ext edema, 2+ pedal pulses, 2+ radial pulses- cath site stable. Neuro:alert and oriented X 3, MAE, follows commands, + facial symmetry    EKG:  EKG is ordered today. The ekg ordered today demonstrates SR  rte 71 no acute changes from previous.   Recent Labs: 05/28/2015: ALT 31 05/31/2015: BUN 11; Creatinine, Ser 0.89; Hemoglobin 12.8; Platelets 248; Potassium 4.1; Sodium 139    Lipid Panel    Component Value Date/Time   CHOL 176 05/28/2015 1740   CHOL 145 03/15/2012 1036   TRIG 204* 05/28/2015 1740   TRIG 209* 03/15/2012 1036   HDL 26* 05/28/2015 1740   HDL 23* 03/15/2012 1036   CHOLHDL 6.8 05/28/2015 1740   VLDL 41* 05/28/2015 1740   VLDL 42* 03/15/2012 1036   LDLCALC 109* 05/28/2015 1740   LDLCALC 80 03/15/2012 1036       Other studies Reviewed: Additional studies/ records that were reviewed today include:. CARDIAC CATH:  Prox RCA to Mid RCA lesion, 10% stenosed.  The left ventricular  systolic function is normal.  Ost LAD to Prox LAD lesion, 100% stenosed. Post intervention, there is a 0% residual stenosis.  1. Single vessel obstructive CAD 2. Normal LV function 3. Successful stenting of the proximal LAD with DES.   2D echo 05/29/15 Study Conclusions  - Left ventricle: The cavity size was normal. Wall thickness was normal. Systolic function was normal. The estimated ejection fraction was in the range of 55% to 60%. Wall motion was normal; there were no regional wall motion abnormalities. Doppler parameters are consistent with abnormal left ventricular relaxation (grade 1 diastolic dysfunction).  Impressions:  - Normal LV systolic function; grade 1 diastolic dysfunction; trace MR and TR.  ASSESSMENT AND PLAN:  1.  STEMI s/p no complaints with DES to LAD.  Troponin peaked 4.38 continue Brilinta BB, ACE --follow up with Dr. Martinique in 6-8 weeks --ok to return to work on the 06/14/15   2. CAD see above  3. Hyperlipidemia goal < 70 recheck lipids CMP in 6 weeks.  4. HTN- improved to soft with both BB and ACE.  Her lisinopril has already been decreased t 5 - if lightheaded or dizzy she will al for BP check and we could decrease ACE.  Normal EF     Current medicines are reviewed with the patient today.  The patient Has no concerns regarding medicines.  The following changes have been made:  See above Labs/ tests ordered today include:see above  Disposition:   FU:  see above  Lennie Muckle, NP  06/07/2015 4:51 PM    [facility] [place] [place] [address], [place] Phone: [phone]; Fax: [phone]

## 2015-06-07 ENCOUNTER — Ambulatory Visit (INDEPENDENT_AMBULATORY_CARE_PROVIDER_SITE_OTHER): Payer: Managed Care, Other (non HMO) | Admitting: Cardiology

## 2015-06-07 ENCOUNTER — Encounter: Payer: Self-pay | Admitting: Cardiology

## 2015-06-07 VITALS — BP 104/72 | HR 71 | Ht 62.0 in | Wt 181.0 lb

## 2015-06-07 DIAGNOSIS — Z79899 Other long term (current) drug therapy: Secondary | ICD-10-CM

## 2015-06-07 DIAGNOSIS — I2510 Atherosclerotic heart disease of native coronary artery without angina pectoris: Secondary | ICD-10-CM | POA: Diagnosis not present

## 2015-06-07 DIAGNOSIS — E785 Hyperlipidemia, unspecified: Secondary | ICD-10-CM | POA: Diagnosis not present

## 2015-06-07 DIAGNOSIS — I2102 ST elevation (STEMI) myocardial infarction involving left anterior descending coronary artery: Secondary | ICD-10-CM

## 2015-06-07 DIAGNOSIS — I10 Essential (primary) hypertension: Secondary | ICD-10-CM

## 2015-06-07 NOTE — Patient Instructions (Signed)
Your physician recommends that you return for lab work in: PRIOR TO NEXT VISIT FASTING AT [place] [facility]  Your physician recommends that you schedule a follow-up appointment in: 6-8 WEEKS WITH DR. Martinique

## 2015-06-10 ENCOUNTER — Telehealth: Payer: Self-pay | Admitting: Cardiology

## 2015-06-10 ENCOUNTER — Encounter: Payer: Self-pay | Admitting: *Deleted

## 2015-06-10 NOTE — Telephone Encounter (Signed)
Notified pt clearance sent to fax # provided. Pt voiced thanks, no further needs at this time.

## 2015-06-10 NOTE — Telephone Encounter (Signed)
Yes she is OK to return to work  Peter Martinique MD, [facility]

## 2015-06-10 NOTE — Telephone Encounter (Signed)
New message       Need return to work note.  Please fax it to [phone] attn Kristine Linea at [employer] at [place].  Pt is going to return to work on 06-14-15.  Please call if there is a problem.

## 2015-06-10 NOTE — Telephone Encounter (Signed)
At last appt, Brenda Guerrero cleared pt to return to work. Advise if OK, will put physician stamp on a letter & fax.  Thanks.

## 2015-06-15 ENCOUNTER — Telehealth: Payer: Self-pay | Admitting: Cardiology

## 2015-06-15 NOTE — Telephone Encounter (Signed)
Received FMLA ([employer]) forms back from [facility] for Dr Martinique to review, and sign.  Put Forms in Dr Doug Sou correspondence. lp

## 2015-06-25 ENCOUNTER — Telehealth: Payer: Self-pay | Admitting: Cardiology

## 2015-06-25 NOTE — Telephone Encounter (Signed)
Received signed [employer] FMLA back from Dr Martinique on 06/17/15.  Faxed to [employer] 06/18/15 and notified patient. lp

## 2015-08-02 ENCOUNTER — Ambulatory Visit: Payer: Managed Care, Other (non HMO) | Admitting: Cardiology

## 2015-08-11 ENCOUNTER — Other Ambulatory Visit
Admission: RE | Admit: 2015-08-11 | Discharge: 2015-08-11 | Disposition: A | Discharge status: Home / Self Care | Payer: Managed Care, Other (non HMO) | Source: Ambulatory Visit | Attending: Nurse Practitioner | Admitting: Nurse Practitioner

## 2015-08-11 DIAGNOSIS — E782 Mixed hyperlipidemia: Secondary | ICD-10-CM | POA: Diagnosis not present

## 2015-08-11 DIAGNOSIS — Z0001 Encounter for general adult medical examination with abnormal findings: Secondary | ICD-10-CM | POA: Insufficient documentation

## 2015-08-11 DIAGNOSIS — E559 Vitamin D deficiency, unspecified: Secondary | ICD-10-CM | POA: Diagnosis not present

## 2015-08-11 DIAGNOSIS — R7301 Impaired fasting glucose: Secondary | ICD-10-CM | POA: Insufficient documentation

## 2015-08-11 DIAGNOSIS — I10 Essential (primary) hypertension: Secondary | ICD-10-CM | POA: Insufficient documentation

## 2015-08-11 LAB — COMPREHENSIVE METABOLIC PANEL
ALBUMIN: 4.3 g/dL (ref 3.5–5.0)
ALT: 27 U/L (ref 14–54)
AST: 26 U/L (ref 15–41)
Alkaline Phosphatase: 121 U/L (ref 38–126)
Anion gap: 4 — ABNORMAL LOW (ref 5–15)
BUN: 18 mg/dL (ref 6–20)
CHLORIDE: 106 mmol/L (ref 101–111)
CO2: 26 mmol/L (ref 22–32)
Calcium: 8.6 mg/dL — ABNORMAL LOW (ref 8.9–10.3)
Creatinine, Ser: 0.76 mg/dL (ref 0.44–1.00)
GFR calc Af Amer: >60 mL/min (ref >60)
GFR calc non Af Amer: >60 mL/min (ref >60)
Glucose, Bld: 103 mg/dL — ABNORMAL HIGH (ref 65–99)
Potassium: 3.7 mmol/L (ref 3.5–5.1)
Sodium: 136 mmol/L (ref 135–145)
TOTAL PROTEIN: 7.8 g/dL (ref 6.5–8.1)
Total Bilirubin: 0.7 mg/dL (ref 0.3–1.2)

## 2015-08-11 LAB — LIPID PANEL
CHOL/HDL RATIO: 3.7 ratio
CHOLESTEROL: 97 mg/dL (ref 0–200)
HDL: 26 mg/dL — AB (ref >40)
LDL Cholesterol: 47 mg/dL (ref 0–99)
TRIGLYCERIDES: 120 mg/dL (ref <150)
VLDL: 24 mg/dL (ref 0–40)

## 2015-08-11 LAB — CBC WITH DIFFERENTIAL/PLATELET
Basophils Absolute: 0.2 10*3/uL — ABNORMAL HIGH (ref 0–0.1)
Basophils Relative: 3 %
EOS ABS: 0.1 10*3/uL (ref 0–0.7)
EOS PCT: 1 %
HCT: 42.7 % (ref 35.0–47.0)
Hemoglobin: 14 g/dL (ref 12.0–16.0)
LYMPHS ABS: 2.7 10*3/uL (ref 1.0–3.6)
LYMPHS PCT: 37 %
MCH: 29.5 pg (ref 26.0–34.0)
MCHC: 32.8 g/dL (ref 32.0–36.0)
MCV: 90 fL (ref 80.0–100.0)
MONO ABS: 0.4 10*3/uL (ref 0.2–0.9)
MONOS PCT: 5 %
Neutro Abs: 3.9 10*3/uL (ref 1.4–6.5)
Neutrophils Relative %: 54 %
PLATELETS: 225 10*3/uL (ref 150–440)
RBC: 4.75 MIL/uL (ref 3.80–5.20)
RDW: 13.1 % (ref 11.5–14.5)
WBC: 7.3 10*3/uL (ref 3.6–11.0)

## 2015-08-11 LAB — HEMOGLOBIN A1C: HEMOGLOBIN A1C: 6.2 % — AB (ref 4.0–6.0)

## 2015-08-11 LAB — T4, FREE: Free T4: 0.8 ng/dL (ref 0.61–1.12)

## 2015-08-11 LAB — TSH: TSH: 1.79 u[IU]/mL (ref 0.350–4.500)

## 2015-08-12 LAB — T3: T3, Total: 151 ng/dL (ref 71–180)

## 2015-08-12 LAB — VITAMIN D 25 HYDROXY (VIT D DEFICIENCY, FRACTURES): Vit D, 25-Hydroxy: 46.1 ng/mL (ref 30.0–100.0)

## 2015-08-16 ENCOUNTER — Other Ambulatory Visit: Payer: Self-pay

## 2015-08-16 MED ORDER — CARVEDILOL 3.125 MG PO TABS: 3.1250 mg | ORAL_TABLET | Freq: Two times a day (BID) | ORAL | Status: DC

## 2015-08-16 MED ORDER — LISINOPRIL 5 MG PO TABS: 5.0000 mg | ORAL_TABLET | Freq: Every day | ORAL | Status: DC

## 2015-08-16 MED ORDER — TICAGRELOR 90 MG PO TABS: 90.0000 mg | ORAL_TABLET | Freq: Two times a day (BID) | ORAL | Status: DC

## 2015-08-16 MED ORDER — ATORVASTATIN CALCIUM 80 MG PO TABS: 80.0000 mg | ORAL_TABLET | Freq: Every day | ORAL | Status: DC

## 2015-08-17 ENCOUNTER — Other Ambulatory Visit: Payer: Self-pay | Admitting: *Deleted

## 2015-08-17 MED ORDER — LISINOPRIL 5 MG PO TABS: 5.0000 mg | ORAL_TABLET | Freq: Every day | ORAL | Status: DC

## 2015-08-20 ENCOUNTER — Ambulatory Visit (INDEPENDENT_AMBULATORY_CARE_PROVIDER_SITE_OTHER): Payer: Managed Care, Other (non HMO) | Admitting: Cardiology

## 2015-08-20 ENCOUNTER — Encounter: Payer: Self-pay | Admitting: Cardiology

## 2015-08-20 VITALS — BP 134/83 | HR 64 | Ht 62.0 in | Wt 169.0 lb

## 2015-08-20 DIAGNOSIS — I10 Essential (primary) hypertension: Secondary | ICD-10-CM | POA: Diagnosis not present

## 2015-08-20 DIAGNOSIS — R7303 Prediabetes: Secondary | ICD-10-CM | POA: Diagnosis not present

## 2015-08-20 DIAGNOSIS — I2510 Atherosclerotic heart disease of native coronary artery without angina pectoris: Secondary | ICD-10-CM | POA: Diagnosis not present

## 2015-08-20 NOTE — Progress Notes (Signed)
Cardiology Office Note   Date:  08/20/2015   ID:  Brenda, Guerrero 02/16/1956, MRN [id number]  PCP:  Ronnell Freshwater, NP  Cardiologist:  Dr. Martinique    Chief Complaint  Patient presents with  . Follow-up    SOB;none.CHEST PAIN;none. LIGHTHEADED/DIZZINESS;none. PAIN OR CRAMPIN IN LEGS;none.EDEMA; swelling in foot due to it being  previous broke.      History of Present Illness: Brenda Guerrero is a 60 y.o. female who presents for follow up CAD.   She has a history of obesity, HTN, HLD, diabetes, asthma and a family hx of CAD who presented to [facility] on 05/28/15 as an anterior STEMI.  Underwent emergent cath and found to have single vessel obstructive CAD, DES to LAD.  Pk troponin 4. 30 minutes of arrival in the ICU the patient had marked increase in chest pain and hyperacute ST elevation consistent with reocclusion. She was taken back to the cath lab and found to have reocclusion of the vessel. She underwent repeat stenting of the LAD and PTCA for acute stent thrombosis. Her pain resolved post revascularization. EF normal by Echo and cath with early reperfusion.   Today she feels very well. Reports she is walking regularly at the mall with her husband. Is trying to build up to a 5K. She quit taking lisinopril one month ago because it made her feel dizzy and sluggish. These symptoms resolved. She has really changed her diet and lost 13 lbs. A1c reduced from 7.2>>6.2%.   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      Past Medical History  Diagnosis Date  . Asthma   . Hypertension   . ST elevation (STEMI) myocardial infarction involving left anterior descending coronary artery ([place]) 05/28/15    with DES to LAD and return to cath lab for restenosis requiring PTCA  . Hyperlipidemia LDL goal <70   . CAD in native artery     Past Surgical History  Procedure Laterality Date  . Tubal ligation    . Cardiac catheterization N/A 05/28/2015    Procedure: Left Heart Cath and  Coronary Angiography;  Surgeon: Kerstyn Coryell M Martinique, MD;  Location: [place] [facility];  Service: Cardiovascular;  Laterality: N/A;  . Cardiac catheterization N/A 05/28/2015    Procedure: Coronary Stent Intervention;  Surgeon: Elijahjames Fuelling M Martinique, MD;  Location: [place] [facility];  Service: Cardiovascular;  Laterality: N/A;  . Cardiac catheterization N/A 05/28/2015    Procedure: Coronary Stent Intervention;  Surgeon: Johnnie Goynes M Martinique, MD;  Location: [place] [facility];  Service: Cardiovascular;  Laterality: N/A;  . Cardiac catheterization N/A 05/28/2015    Procedure: Coronary/Graft Angiography;  Surgeon: Nature Vogelsang M Martinique, MD;  Location: [place] [facility];  Service: Cardiovascular;  Laterality: N/A;  . Cardiac catheterization N/A 05/28/2015    Procedure: Intravascular Ultrasound/IVUS;  Surgeon: Gwenetta Devos M Martinique, MD;  Location: [place] [facility];  Service: Cardiovascular;  Laterality: N/A;     Current Outpatient Prescriptions  Medication Sig Dispense Refill  . alendronate (FOSAMAX) 70 MG tablet Take 1 tablet by mouth once a week.  1  . aspirin 81 MG chewable tablet Chew 1 tablet (81 mg total) by mouth daily.    Marland Kitchen atorvastatin (LIPITOR) 80 MG tablet Take 1 tablet (80 mg total) by mouth daily at 6 PM. 90 tablet 0  . calcium carbonate (OS-CAL) 1250 (500 Ca) MG chewable tablet Chew 1 tablet by mouth daily.    . carvedilol (COREG) 3.125 MG tablet  Take 1 tablet (3.125 mg total) by mouth 2 (two) times daily with a meal. 120 tablet 0  . cholecalciferol (VITAMIN D) 1000 units tablet Take 1,000 Units by mouth daily.    . fexofenadine-pseudoephedrine (ALLEGRA-D 24) 180-240 MG 24 hr tablet Take 1 tablet by mouth daily.    . fluticasone (FLONASE) 50 MCG/ACT nasal spray Place 1 spray into both nostrils daily.    . nitroGLYCERIN (NITROSTAT) 0.4 MG SL tablet Place 1 tablet (0.4 mg total) under the tongue every 5 (five) minutes as needed for chest pain. 25 tablet 2  . ticagrelor (BRILINTA) 90 MG TABS tablet Take 1 tablet (90 mg  total) by mouth 2 (two) times daily. 60 tablet 10  . ticagrelor (BRILINTA) 90 MG TABS tablet Take 1 tablet (90 mg total) by mouth 2 (two) times daily. 180 tablet 0   No current facility-administered medications for this visit.    Allergies:   Review of patient's allergies indicates no known allergies.    Social History:  The patient  reports that she has never smoked. She has never used smokeless tobacco. She reports that she does not use illicit drugs.   Family History:  The patient's   family history includes Heart attack (age of onset: 67) in her brother; Heart disease in her father.    ROS:  As noted in HPI.  All other systems are reviewed and are negative.   Wt Readings from Last 3 Encounters:  08/20/15 76.658 kg (169 lb)  06/07/15 82.101 kg (181 lb)  05/31/15 82.872 kg (182 lb 11.2 oz)     PHYSICAL EXAM: VS:  BP 134/83 mmHg  Pulse 64  Ht 5\' 2"  (1.575 m)  Wt 76.658 kg (169 lb)  BMI 30.90 kg/m2 , BMI Body mass index is 30.9 kg/(m^2). General:Pleasant affect, NAD Skin:Warm and dry, brisk capillary refill HEENT:normocephalic, sclera clear, mucus membranes moist Neck:supple, no JVD, no bruits  Heart:S1S2 RRR without murmur, gallup, rub or click Lungs:clear without rales, rhonchi, or wheezes [id number], non tender, + BS, do not palpate liver spleen or masses Ext:no lower ext edema, 2+ pedal pulses, 2+ radial pulses. Neuro:alert and oriented X 3, MAE, follows commands, + facial symmetry    EKG:  EKG is not ordered today. The ekg ordered today demonstrates N/A   Recent Labs: 08/11/2015: ALT 27; BUN 18; Creatinine, Ser 0.76; Hemoglobin 14.0; Platelets 225; Potassium 3.7; Sodium 136; TSH 1.790    Lipid Panel    Component Value Date/Time   CHOL 97 08/11/2015 0906   CHOL 145 03/15/2012 1036   TRIG 120 08/11/2015 0906   TRIG 209* 03/15/2012 1036   HDL 26* 08/11/2015 0906   HDL 23* 03/15/2012 1036   CHOLHDL 3.7 08/11/2015 0906   VLDL 24 08/11/2015 0906   VLDL 42*  03/15/2012 1036   LDLCALC 47 08/11/2015 0906   LDLCALC 80 03/15/2012 1036       Other studies Reviewed: Additional studies/ records that were reviewed today include:. CARDIAC CATH:  Prox RCA to Mid RCA lesion, 10% stenosed.  The left ventricular systolic function is normal.  Ost LAD to Prox LAD lesion, 100% stenosed. Post intervention, there is a 0% residual stenosis.  1. Single vessel obstructive CAD 2. Normal LV function 3. Successful stenting of the proximal LAD with DES.   2D echo 05/29/15 Study Conclusions  - Left ventricle: The cavity size was normal. Wall thickness was normal. Systolic function was normal. The estimated ejection fraction was in the range of 55% to 60%.  Wall motion was normal; there were no regional wall motion abnormalities. Doppler parameters are consistent with abnormal left ventricular relaxation (grade 1 diastolic dysfunction).  Impressions:  - Normal LV systolic function; grade 1 diastolic dysfunction; trace MR and TR.  ASSESSMENT AND PLAN:  1.  Anterior STEMI s/p  DES to LAD.  Early reperfusion with normal EF. Ok to stop lisinopril. Continue DAPT for one year. Aggressive risk factor modification.  --follow up with me in 4 months.  2. CAD see above  3. Hyperlipidemia excellent control. Continue statin.  4. HTN- controlled on Coreg only. Continue  5. DM. Improved with dietary modification. Will follow up with primary MD>   Current medicines are reviewed with the patient today.  The patient Has no concerns regarding medicines.  The following changes have been made:  See above Labs/ tests ordered today include:see above  Disposition:   FU:  see above  Signed, Christyne Mccain Martinique, MD  08/20/2015 10:19 AM    [facility] [address] [place], [place] Phone: [phone]; Fax: [phone]

## 2015-08-20 NOTE — Patient Instructions (Signed)
Continue your current therapy  I will see you in 4 months  

## 2015-11-17 ENCOUNTER — Other Ambulatory Visit: Payer: Self-pay | Admitting: Cardiology

## 2015-11-17 NOTE — Telephone Encounter (Signed)
Rx request sent to pharmacy.  

## 2016-01-06 ENCOUNTER — Ambulatory Visit: Payer: Managed Care, Other (non HMO) | Admitting: Cardiology

## 2016-01-06 ENCOUNTER — Encounter: Payer: Self-pay | Admitting: Cardiology

## 2016-01-06 ENCOUNTER — Ambulatory Visit (INDEPENDENT_AMBULATORY_CARE_PROVIDER_SITE_OTHER): Payer: Managed Care, Other (non HMO) | Admitting: Cardiology

## 2016-01-06 VITALS — BP 120/56 | HR 62 | Ht 62.5 in | Wt 155.6 lb

## 2016-01-06 DIAGNOSIS — I10 Essential (primary) hypertension: Secondary | ICD-10-CM | POA: Diagnosis not present

## 2016-01-06 DIAGNOSIS — I2510 Atherosclerotic heart disease of native coronary artery without angina pectoris: Secondary | ICD-10-CM | POA: Diagnosis not present

## 2016-01-06 DIAGNOSIS — E785 Hyperlipidemia, unspecified: Secondary | ICD-10-CM

## 2016-01-06 NOTE — Patient Instructions (Signed)
Continue your current therapy  I will see you in 6 months.   

## 2016-01-06 NOTE — Progress Notes (Signed)
Cardiology Office Note   Date:  01/06/2016   ID:  Kirbie, Shimon January 06, 1956, MRN [id number]  PCP:  Ronnell Freshwater, NP  Cardiologist:  Dr. Martinique    Chief Complaint  Patient presents with  . Coronary Artery Disease  . Hypertension      History of Present Illness: Brenda Guerrero is a 60 y.o. female who presents for follow up CAD.   She has a history of obesity, HTN, HLD, diabetes, asthma and a family hx of CAD. She presented to [facility] on 05/28/15 as an anterior STEMI.  Underwent emergent cath and found to have single vessel obstructive CAD, DES to LAD.  Pk troponin 4. 30 minutes of arrival in the ICU the patient had marked increase in chest pain and hyperacute ST elevation consistent with reocclusion. She was taken back to the cath lab and found to have reocclusion of the vessel. She underwent repeat stenting of the LAD and PTCA for acute stent thrombosis. Her pain resolved post revascularization. EF normal by Echo and cath with early reperfusion.   Today she feels very well. Reports she is walking regularly.She has really changed her diet and lost 27 lbs since February. Last fasting blood sugar 102.    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      Past Medical History:  Diagnosis Date  . Asthma   . CAD in native artery   . Hyperlipidemia LDL goal <70   . Hypertension   . ST elevation (STEMI) myocardial infarction involving left anterior descending coronary artery ([place]) 05/28/15   with DES to LAD and return to cath lab for restenosis requiring PTCA    Past Surgical History:  Procedure Laterality Date  . CARDIAC CATHETERIZATION N/A 05/28/2015   Procedure: Left Heart Cath and Coronary Angiography;  Surgeon: Peter M Martinique, MD;  Location: [place] [facility];  Service: Cardiovascular;  Laterality: N/A;  . CARDIAC CATHETERIZATION N/A 05/28/2015   Procedure: Coronary Stent Intervention;  Surgeon: Peter M Martinique, MD;  Location: [place] [facility];  Service:  Cardiovascular;  Laterality: N/A;  . CARDIAC CATHETERIZATION N/A 05/28/2015   Procedure: Coronary Stent Intervention;  Surgeon: Peter M Martinique, MD;  Location: [place] [facility];  Service: Cardiovascular;  Laterality: N/A;  . CARDIAC CATHETERIZATION N/A 05/28/2015   Procedure: Coronary/Graft Angiography;  Surgeon: Peter M Martinique, MD;  Location: [place] [facility];  Service: Cardiovascular;  Laterality: N/A;  . CARDIAC CATHETERIZATION N/A 05/28/2015   Procedure: Intravascular Ultrasound/IVUS;  Surgeon: Peter M Martinique, MD;  Location: [place] [facility];  Service: Cardiovascular;  Laterality: N/A;  . TUBAL LIGATION       Current Outpatient Prescriptions  Medication Sig Dispense Refill  . alendronate (FOSAMAX) 70 MG tablet Take 1 tablet by mouth once a week.  1  . amoxicillin (AMOXIL) 875 MG tablet Take 1 tablet by mouth as directed.    Marland Kitchen aspirin 81 MG chewable tablet Chew 1 tablet (81 mg total) by mouth daily.    Marland Kitchen atorvastatin (LIPITOR) 80 MG tablet TAKE 1 TABLET (80 MG TOTAL) BY MOUTH DAILY AT 6 PM. 90 tablet 1  . BRILINTA 90 MG TABS tablet TAKE 1 TABLET BY MOUTH TWO TIMES DAILY 180 tablet 1  . calcium carbonate (OS-CAL) 1250 (500 Ca) MG chewable tablet Chew 1 tablet by mouth daily.    . carvedilol (COREG) 3.125 MG tablet Take 1 tablet (3.125 mg total) by mouth 2 (two) times daily with a meal. 120 tablet  0  . cholecalciferol (VITAMIN D) 1000 units tablet Take 1,000 Units by mouth daily.    . fexofenadine-pseudoephedrine (ALLEGRA-D 24) 180-240 MG 24 hr tablet Take 1 tablet by mouth daily.    . fluticasone (FLONASE) 50 MCG/ACT nasal spray Place 1 spray into both nostrils daily.    . nitroGLYCERIN (NITROSTAT) 0.4 MG SL tablet Place 1 tablet (0.4 mg total) under the tongue every 5 (five) minutes as needed for chest pain. 25 tablet 2   No current facility-administered medications for this visit.     Allergies:   Review of patient's allergies indicates no known allergies.    Social History:  The  patient  reports that she has never smoked. She has never used smokeless tobacco. She reports that she does not use drugs.   Family History:  The patient's   family history includes Heart attack (age of onset: 59) in her brother; Heart disease in her father.    ROS:  As noted in HPI.  All other systems are reviewed and are negative.   Wt Readings from Last 3 Encounters:  01/06/16 155 lb 9.6 oz (70.6 kg)  08/20/15 169 lb (76.7 kg)  06/07/15 181 lb (82.1 kg)     PHYSICAL EXAM: VS:  BP (!) 120/56   Pulse 62   Ht 5' 2.5" (1.588 m)   Wt 155 lb 9.6 oz (70.6 kg)   BMI 28.01 kg/m  , BMI Body mass index is 28.01 kg/m. General:Pleasant affect, NAD Skin:Warm and dry, brisk capillary refill HEENT:normocephalic, sclera clear, mucus membranes moist Neck:supple, no JVD, no bruits  Heart:S1S2 RRR without murmur, gallup, rub or click Lungs:clear without rales, rhonchi, or wheezes [id number], non tender, + BS, do not palpate liver spleen or masses Ext:no lower ext edema, 2+ pedal pulses, 2+ radial pulses. Neuro:alert and oriented X 3, MAE, follows commands, + facial symmetry    EKG:  EKG is not ordered today. The ekg ordered today demonstrates N/A   Recent Labs: 08/11/2015: ALT 27; BUN 18; Creatinine, Ser 0.76; Hemoglobin 14.0; Platelets 225; Potassium 3.7; Sodium 136; TSH 1.790    Lipid Panel    Component Value Date/Time   CHOL 97 08/11/2015 0906   CHOL 145 03/15/2012 1036   TRIG 120 08/11/2015 0906   TRIG 209 (H) 03/15/2012 1036   HDL 26 (L) 08/11/2015 0906   HDL 23 (L) 03/15/2012 1036   CHOLHDL 3.7 08/11/2015 0906   VLDL 24 08/11/2015 0906   VLDL 42 (H) 03/15/2012 1036   LDLCALC 47 08/11/2015 0906   LDLCALC 80 03/15/2012 1036       Other studies Reviewed: Additional studies/ records that were reviewed today include:. CARDIAC CATH:  Prox RCA to Mid RCA lesion, 10% stenosed.  The left ventricular systolic function is normal.  Ost LAD to Prox LAD lesion, 100% stenosed.  Post intervention, there is a 0% residual stenosis.  1. Single vessel obstructive CAD 2. Normal LV function 3. Successful stenting of the proximal LAD with DES.   2D echo 05/29/15 Study Conclusions  - Left ventricle: The cavity size was normal. Wall thickness was normal. Systolic function was normal. The estimated ejection fraction was in the range of 55% to 60%. Wall motion was normal; there were no regional wall motion abnormalities. Doppler parameters are consistent with abnormal left ventricular relaxation (grade 1 diastolic dysfunction).  Impressions:  - Normal LV systolic function; grade 1 diastolic dysfunction; trace MR and TR.  ASSESSMENT AND PLAN:  1.  Anterior STEMI s/p  DES  to LAD.  Early reperfusion with normal EF. Continue DAPT for one year. Continue aggressive risk factor modification.  --follow up with me in 6 months. Consider stopping Brilinta at that time.  2. CAD as in #1.  3. Hyperlipidemia excellent control. Continue statin.  4. HTN- controlled on Coreg only. Continue  5. DM. Improved with dietary modification.    Current medicines are reviewed with the patient today.  The patient Has no concerns regarding medicines.  The following changes have been made:  See above Labs/ tests ordered today include:see above  Disposition:   FU:  6 months  Signed, Peter Martinique, MD  01/06/2016 4:36 PM    [facility] [address] [place], [place] Phone: [phone]; Fax: [phone]

## 2016-02-09 ENCOUNTER — Other Ambulatory Visit: Payer: Self-pay | Admitting: Cardiology

## 2016-02-09 NOTE — Telephone Encounter (Signed)
Rx(s) sent to pharmacy electronically.  

## 2016-04-08 ENCOUNTER — Ambulatory Visit
Admission: EM | Admit: 2016-04-08 | Discharge: 2016-04-08 | Disposition: A | Discharge status: Home / Self Care | Payer: Managed Care, Other (non HMO) | Attending: Family Medicine | Admitting: Family Medicine

## 2016-04-08 ENCOUNTER — Encounter: Payer: Self-pay | Admitting: Emergency Medicine

## 2016-04-08 DIAGNOSIS — L230 Allergic contact dermatitis due to metals: Secondary | ICD-10-CM | POA: Diagnosis not present

## 2016-04-08 MED ORDER — TRIAMCINOLONE ACETONIDE 0.1 % EX CREA: 1.0000 "application " | TOPICAL_CREAM | Freq: Two times a day (BID) | CUTANEOUS | 0 refills | Status: DC

## 2016-04-08 NOTE — ED Provider Notes (Signed)
[facility]    CSN: [id number] Arrival date & time: 04/08/16  1259     History   Chief Complaint Chief Complaint  Patient presents with  . Rash    HPI Brenda Guerrero is a 60 y.o. female.   The history is provided by the patient.  Rash  Location:  Head/neck Head/neck rash location:  R neck and L neck Quality: itchiness and redness   Onset quality:  Sudden Duration:  6 hours Timing:  Constant Progression:  Unchanged Chronicity:  New Context: not animal contact, not chemical exposure, not diapers, not eggs, not exposure to similar rash, not food, not hot tub use, not insect bite/sting, not medications, not new detergent/soap, not nuts, not plant contact, not pollen, not sick contacts and not sun exposure   Context comment:  Wore a silver necklace yesterday and overnight; woke up in am with rash Relieved by:  None tried Ineffective treatments:  None tried Associated symptoms: no abdominal pain, no diarrhea, no fatigue, no fever, no headaches, no hoarse voice, no induration, no joint pain, no myalgias, no nausea, no periorbital edema, no shortness of breath, no sore throat, no throat swelling, no tongue swelling, no URI, not vomiting and not wheezing     Past Medical History:  Diagnosis Date  . Asthma   . CAD in native artery   . Hyperlipidemia LDL goal <70   . Hypertension   . ST elevation (STEMI) myocardial infarction involving left anterior descending coronary artery ([place]) 05/28/15   with DES to LAD and return to cath lab for restenosis requiring PTCA    Patient Active Problem List   Diagnosis Date Noted  . Dyslipidemia 01/06/2016  . CAD (coronary artery disease) 08/20/2015  . ST elevation (STEMI) myocardial infarction involving left anterior descending coronary artery ([place]) 05/28/2015  . HTN (hypertension) 05/28/2015  . Prediabetes 05/28/2015    Past Surgical History:  Procedure Laterality Date  . CARDIAC CATHETERIZATION N/A 05/28/2015   Procedure: Left Heart Cath and Coronary Angiography;  Surgeon: Peter M Martinique, MD;  Location: [place] [facility];  Service: Cardiovascular;  Laterality: N/A;  . CARDIAC CATHETERIZATION N/A 05/28/2015   Procedure: Coronary Stent Intervention;  Surgeon: Peter M Martinique, MD;  Location: [place] [facility];  Service: Cardiovascular;  Laterality: N/A;  . CARDIAC CATHETERIZATION N/A 05/28/2015   Procedure: Coronary Stent Intervention;  Surgeon: Peter M Martinique, MD;  Location: [place] [facility];  Service: Cardiovascular;  Laterality: N/A;  . CARDIAC CATHETERIZATION N/A 05/28/2015   Procedure: Coronary/Graft Angiography;  Surgeon: Peter M Martinique, MD;  Location: [place] [facility];  Service: Cardiovascular;  Laterality: N/A;  . CARDIAC CATHETERIZATION N/A 05/28/2015   Procedure: Intravascular Ultrasound/IVUS;  Surgeon: Peter M Martinique, MD;  Location: [place] [facility];  Service: Cardiovascular;  Laterality: N/A;  . TUBAL LIGATION      OB History    No data available       Home Medications    Prior to Admission medications   Medication Sig Start Date End Date Taking? Authorizing Provider  alendronate (FOSAMAX) 70 MG tablet Take 1 tablet by mouth once a week. 08/04/15   Historical Provider, MD  aspirin 81 MG chewable tablet Chew 1 tablet (81 mg total) by mouth daily. 05/31/15   Brittainy Erie Noe, PA-C  atorvastatin (LIPITOR) 80 MG tablet TAKE 1 TABLET (80 MG TOTAL) BY MOUTH DAILY AT 6 PM. 11/17/15   Peter M Martinique, MD  BRILINTA 90 MG TABS tablet TAKE 1  TABLET BY MOUTH TWO TIMES DAILY 11/17/15   Peter M Martinique, MD  calcium carbonate (OS-CAL) 1250 (500 Ca) MG chewable tablet Chew 1 tablet by mouth daily.    Historical Provider, MD  carvedilol (COREG) 3.125 MG tablet TAKE 1 TABLET (3.125 MG TOTAL) BY MOUTH 2 (TWO) TIMES DAILY WITH A MEAL. 02/09/16   Peter M Martinique, MD  cholecalciferol (VITAMIN D) 1000 units tablet Take 1,000 Units by mouth daily.    Historical Provider, MD  fluticasone (FLONASE) 50 MCG/ACT  nasal spray Place 1 spray into both nostrils daily.    Historical Provider, MD  nitroGLYCERIN (NITROSTAT) 0.4 MG SL tablet Place 1 tablet (0.4 mg total) under the tongue every 5 (five) minutes as needed for chest pain. 05/31/15   Brittainy Erie Noe, PA-C  triamcinolone cream (KENALOG) 0.1 % Apply 1 application topically 2 (two) times daily. 04/08/16   Norval Gable, MD    Family History Family History  Problem Relation Age of Onset  . Heart disease Father   . Heart attack Brother 53    Social History Social History  Substance Use Topics  . Smoking status: Never Smoker  . Smokeless tobacco: Never Used  . Alcohol use No     Allergies   Patient has no known allergies.   Review of Systems Review of Systems  Constitutional: Negative for fatigue and fever.  HENT: Negative for hoarse voice and sore throat.   Respiratory: Negative for shortness of breath and wheezing.   Gastrointestinal: Negative for abdominal pain, diarrhea, nausea and vomiting.  Musculoskeletal: Negative for arthralgias and myalgias.  Skin: Positive for rash.  Neurological: Negative for headaches.     Physical Exam Triage Vital Signs ED Triage Vitals  Enc Vitals Group     BP 04/08/16 1400 (!) 158/83     Pulse Rate 04/08/16 1400 63     Resp 04/08/16 1400 16     Temp 04/08/16 1400 98.6 F (37 C)     Temp Source 04/08/16 1400 Oral     SpO2 04/08/16 1400 100 %     Weight 04/08/16 1359 152 lb (68.9 kg)     Height 04/08/16 1359 5\' 2"  (1.575 m)     Head Circumference --      Peak Flow --      Pain Score 04/08/16 1400 2     Pain Loc --      Pain Edu? --      Excl. in [place]? --    No data found.   Updated Vital Signs BP (!) 158/83 (BP Location: Left Arm)   Pulse 63   Temp 98.6 F (37 C) (Oral)   Resp 16   Ht 5\' 2"  (1.575 m)   Wt 152 lb (68.9 kg)   SpO2 100%   BMI 27.80 kg/m   Visual Acuity Right Eye Distance:   Left Eye Distance:   Bilateral Distance:    Right Eye Near:   Left Eye Near:      Bilateral Near:     Physical Exam  Constitutional: She appears well-developed and well-nourished. No distress.  Pulmonary/Chest: Effort normal. No respiratory distress.  Skin: Rash noted. Rash is papular. She is not diaphoretic. There is erythema.     Nursing note and vitals reviewed.    UC Treatments / Results  Labs (all labs ordered are listed, but only abnormal results are displayed) Labs Reviewed - No data to display  EKG  EKG Interpretation None       Radiology  No results found.  Procedures Procedures (including critical care time)  Medications Ordered in UC Medications - No data to display   Initial Impression / Assessment and Plan / UC Course  I have reviewed the triage vital signs and the nursing notes.  Pertinent labs & imaging results that were available during my care of the patient were reviewed by me and considered in my medical decision making (see chart for details).  Clinical Course       Final Clinical Impressions(s) / UC Diagnoses   Final diagnoses:  Allergic contact dermatitis due to metals    New Prescriptions Discharge Medication List as of 04/08/2016  2:25 PM    START taking these medications   Details  triamcinolone cream (KENALOG) 0.1 % Apply 1 application topically 2 (two) times daily., Starting Sat 04/08/2016, Normal       1. diagnosis reviewed with patient 2. rx as per orders above; reviewed possible side effects, interactions, risks and benefits  3. Recommend supportive treatment with otc antihistamines prn 4. Follow-up prn if symptoms worsen or don't improve   Norval Gable, MD 04/08/16 1438

## 2016-04-08 NOTE — ED Triage Notes (Signed)
Patient states she has an itchy red rash that goes around her neck.  Patient states that last night she went to bed with a silver necklace around her neck.

## 2016-05-01 ENCOUNTER — Other Ambulatory Visit: Payer: Self-pay | Admitting: Nurse Practitioner

## 2016-05-01 DIAGNOSIS — Z1231 Encounter for screening mammogram for malignant neoplasm of breast: Secondary | ICD-10-CM

## 2016-05-23 ENCOUNTER — Ambulatory Visit: Payer: Managed Care, Other (non HMO)

## 2016-06-06 ENCOUNTER — Other Ambulatory Visit: Payer: Self-pay | Admitting: Cardiology

## 2016-06-06 NOTE — Telephone Encounter (Signed)
Rx(s) sent to pharmacy electronically.  

## 2016-06-20 ENCOUNTER — Ambulatory Visit
Admission: RE | Admit: 2016-06-20 | Discharge: 2016-06-20 | Disposition: A | Discharge status: Home / Self Care | Payer: Commercial Managed Care - PPO | Source: Ambulatory Visit | Attending: Nurse Practitioner | Admitting: Nurse Practitioner

## 2016-06-20 DIAGNOSIS — Z1231 Encounter for screening mammogram for malignant neoplasm of breast: Secondary | ICD-10-CM | POA: Insufficient documentation

## 2016-06-22 ENCOUNTER — Other Ambulatory Visit: Payer: Self-pay | Admitting: Nurse Practitioner

## 2016-06-22 DIAGNOSIS — N6320 Unspecified lump in the left breast, unspecified quadrant: Secondary | ICD-10-CM

## 2016-06-22 DIAGNOSIS — R928 Other abnormal and inconclusive findings on diagnostic imaging of breast: Secondary | ICD-10-CM

## 2016-06-22 DIAGNOSIS — N6489 Other specified disorders of breast: Secondary | ICD-10-CM

## 2016-06-29 ENCOUNTER — Ambulatory Visit
Admission: RE | Admit: 2016-06-29 | Discharge: 2016-06-29 | Disposition: A | Discharge status: Home / Self Care | Payer: Commercial Managed Care - PPO | Source: Ambulatory Visit | Attending: Nurse Practitioner | Admitting: Nurse Practitioner

## 2016-06-29 DIAGNOSIS — N6489 Other specified disorders of breast: Secondary | ICD-10-CM | POA: Diagnosis not present

## 2016-06-29 DIAGNOSIS — N6320 Unspecified lump in the left breast, unspecified quadrant: Secondary | ICD-10-CM

## 2016-06-29 DIAGNOSIS — R928 Other abnormal and inconclusive findings on diagnostic imaging of breast: Secondary | ICD-10-CM

## 2016-08-02 ENCOUNTER — Other Ambulatory Visit: Payer: Self-pay

## 2016-08-02 MED ORDER — CARVEDILOL 3.125 MG PO TABS: 3.1250 mg | ORAL_TABLET | Freq: Two times a day (BID) | ORAL | 1 refills | Status: DC

## 2016-08-02 NOTE — Telephone Encounter (Signed)
Rx(s) sent to pharmacy electronically.  

## 2016-12-04 ENCOUNTER — Encounter: Payer: Self-pay | Admitting: Emergency Medicine

## 2016-12-04 ENCOUNTER — Other Ambulatory Visit
Admission: RE | Admit: 2016-12-04 | Discharge: 2016-12-04 | Disposition: A | Discharge status: Home / Self Care | Payer: Commercial Managed Care - PPO | Source: Ambulatory Visit | Attending: Nurse Practitioner | Admitting: Nurse Practitioner

## 2016-12-04 ENCOUNTER — Ambulatory Visit
Admission: EM | Admit: 2016-12-04 | Discharge: 2016-12-04 | Disposition: A | Discharge status: Home / Self Care | Payer: Commercial Managed Care - PPO | Attending: Family Medicine | Admitting: Family Medicine

## 2016-12-04 ENCOUNTER — Ambulatory Visit: Payer: Managed Care, Other (non HMO) | Admitting: Cardiology

## 2016-12-04 DIAGNOSIS — S91331A Puncture wound without foreign body, right foot, initial encounter: Secondary | ICD-10-CM | POA: Diagnosis not present

## 2016-12-04 DIAGNOSIS — W450XXA Nail entering through skin, initial encounter: Secondary | ICD-10-CM | POA: Diagnosis not present

## 2016-12-04 DIAGNOSIS — E785 Hyperlipidemia, unspecified: Secondary | ICD-10-CM | POA: Insufficient documentation

## 2016-12-04 DIAGNOSIS — I10 Essential (primary) hypertension: Secondary | ICD-10-CM | POA: Insufficient documentation

## 2016-12-04 DIAGNOSIS — Z23 Encounter for immunization: Secondary | ICD-10-CM | POA: Diagnosis not present

## 2016-12-04 DIAGNOSIS — Z0000 Encounter for general adult medical examination without abnormal findings: Secondary | ICD-10-CM | POA: Insufficient documentation

## 2016-12-04 LAB — CBC WITH DIFFERENTIAL/PLATELET
Basophils Absolute: 0.1 10*3/uL (ref 0–0.1)
Basophils Relative: 1 %
EOS ABS: 0.3 10*3/uL (ref 0–0.7)
Eosinophils Relative: 3 %
HEMATOCRIT: 41.3 % (ref 35.0–47.0)
HEMOGLOBIN: 13.8 g/dL (ref 12.0–16.0)
LYMPHS ABS: 2.8 10*3/uL (ref 1.0–3.6)
Lymphocytes Relative: 33 %
MCH: 30.8 pg (ref 26.0–34.0)
MCHC: 33.5 g/dL (ref 32.0–36.0)
MCV: 91.9 fL (ref 80.0–100.0)
MONO ABS: 0.5 10*3/uL (ref 0.2–0.9)
MONOS PCT: 5 %
NEUTROS PCT: 58 %
Neutro Abs: 4.9 10*3/uL (ref 1.4–6.5)
Platelets: 243 10*3/uL (ref 150–440)
RBC: 4.49 MIL/uL (ref 3.80–5.20)
RDW: 13.4 % (ref 11.5–14.5)
WBC: 8.5 10*3/uL (ref 3.6–11.0)

## 2016-12-04 LAB — COMPREHENSIVE METABOLIC PANEL
ALK PHOS: 110 U/L (ref 38–126)
ALT: 23 U/L (ref 14–54)
ANION GAP: 8 (ref 5–15)
AST: 27 U/L (ref 15–41)
Albumin: 4.2 g/dL (ref 3.5–5.0)
BILIRUBIN TOTAL: 0.6 mg/dL (ref 0.3–1.2)
BUN: 19 mg/dL (ref 6–20)
CALCIUM: 9 mg/dL (ref 8.9–10.3)
CO2: 26 mmol/L (ref 22–32)
Chloride: 104 mmol/L (ref 101–111)
Creatinine, Ser: 0.91 mg/dL (ref 0.44–1.00)
GFR calc non Af Amer: >60 mL/min (ref >60)
Glucose, Bld: 113 mg/dL — ABNORMAL HIGH (ref 65–99)
Potassium: 3.9 mmol/L (ref 3.5–5.1)
SODIUM: 138 mmol/L (ref 135–145)
TOTAL PROTEIN: 7.5 g/dL (ref 6.5–8.1)

## 2016-12-04 LAB — T4, FREE: FREE T4: 0.82 ng/dL (ref 0.61–1.12)

## 2016-12-04 LAB — TSH: TSH: 3.252 u[IU]/mL (ref 0.350–4.500)

## 2016-12-04 LAB — LIPID PANEL
CHOL/HDL RATIO: 4.6 ratio
CHOLESTEROL: 138 mg/dL (ref 0–200)
HDL: 30 mg/dL — AB (ref >40)
LDL Cholesterol: 65 mg/dL (ref 0–99)
Triglycerides: 214 mg/dL — ABNORMAL HIGH (ref <150)
VLDL: 43 mg/dL — ABNORMAL HIGH (ref 0–40)

## 2016-12-04 LAB — HEMOGLOBIN A1C
HEMOGLOBIN A1C: 6 % — AB (ref 4.8–5.6)
Mean Plasma Glucose: 125.5 mg/dL

## 2016-12-04 MED ORDER — CIPROFLOXACIN HCL 500 MG PO TABS: 500.0000 mg | ORAL_TABLET | Freq: Two times a day (BID) | ORAL | 0 refills | Status: AC

## 2016-12-04 MED ORDER — TETANUS-DIPHTH-ACELL PERTUSSIS 5-2.5-18.5 LF-MCG/0.5 IM SUSP
0.5000 mL | Freq: Once | INTRAMUSCULAR | Status: AC
  Administered 2016-12-04: 0.5 mL via INTRAMUSCULAR

## 2016-12-04 NOTE — ED Triage Notes (Signed)
Patient states that she stepped on a nail with her right foot yesterday afternoon.

## 2016-12-04 NOTE — ED Provider Notes (Signed)
[facility] ____________________________________________  Time seen: Approximately 8:58 PM  I have reviewed the triage vital signs and the nursing notes.   HISTORY  Chief Complaint Puncture Wound  HPI Brenda Guerrero is a 61 y.o. female presents for evaluation of right foot plantar wound after stepping on a rusty nail outside her house yesterday evening. States it was a roofing nail. States she was wearing rubber soled flip flops and the nail did penetrate the skin, but states "did not go in deep." States called her primary office to verify when her last tetanus immunization was, stating it was 7 years ago. Patient states that she did clean the area multiple times last night. States minimal tenderness directly at puncture location, denies any other pain. Denies paresthesias, decreased range of motion, bony tenderness or other concerns. Denies fall or other injury. Denies recent antibiotic use.   Denies chest pain, shortness of breath, abdominal pain, dysuria, extremity pain, extremity swelling or rash. Denies recent sickness. Denies recent antibiotic use. Denies renal insufficiency. Patient reports borderline diabetes not currently on medications.   Ronnell Freshwater, NP: PCP   Past Medical History:  Diagnosis Date  . Asthma   . CAD in native artery   . Hyperlipidemia LDL goal <70   . Hypertension   . ST elevation (STEMI) myocardial infarction involving left anterior descending coronary artery ([place]) 05/28/15   with DES to LAD and return to cath lab for restenosis requiring PTCA    Patient Active Problem List   Diagnosis Date Noted  . Dyslipidemia 01/06/2016  . CAD (coronary artery disease) 08/20/2015  . ST elevation (STEMI) myocardial infarction involving left anterior descending coronary artery ([place]) 05/28/2015  . HTN (hypertension) 05/28/2015  . Prediabetes 05/28/2015    Past Surgical History:  Procedure Laterality Date  . CARDIAC CATHETERIZATION N/A  05/28/2015   Procedure: Left Heart Cath and Coronary Angiography;  Surgeon: Peter M Martinique, MD;  Location: [place] [facility];  Service: Cardiovascular;  Laterality: N/A;  . CARDIAC CATHETERIZATION N/A 05/28/2015   Procedure: Coronary Stent Intervention;  Surgeon: Peter M Martinique, MD;  Location: [place] [facility];  Service: Cardiovascular;  Laterality: N/A;  . CARDIAC CATHETERIZATION N/A 05/28/2015   Procedure: Coronary Stent Intervention;  Surgeon: Peter M Martinique, MD;  Location: [place] [facility];  Service: Cardiovascular;  Laterality: N/A;  . CARDIAC CATHETERIZATION N/A 05/28/2015   Procedure: Coronary/Graft Angiography;  Surgeon: Peter M Martinique, MD;  Location: [place] [facility];  Service: Cardiovascular;  Laterality: N/A;  . CARDIAC CATHETERIZATION N/A 05/28/2015   Procedure: Intravascular Ultrasound/IVUS;  Surgeon: Peter M Martinique, MD;  Location: [place] [facility];  Service: Cardiovascular;  Laterality: N/A;  . TUBAL LIGATION       No current facility-administered medications for this encounter.   Current Outpatient Prescriptions:  .  alendronate (FOSAMAX) 70 MG tablet, Take 1 tablet by mouth once a week., Disp: , Rfl: 1 .  aspirin 81 MG chewable tablet, Chew 1 tablet (81 mg total) by mouth daily., Disp: , Rfl:  .  atorvastatin (LIPITOR) 80 MG tablet, Take 1 tablet (80 mg total) by mouth daily at 6 PM., Disp: 90 tablet, Rfl: 2 .  calcium carbonate (OS-CAL) 1250 (500 Ca) MG chewable tablet, Chew 1 tablet by mouth daily., Disp: , Rfl:  .  carvedilol (COREG) 3.125 MG tablet, Take 1 tablet (3.125 mg total) by mouth 2 (two) times daily with a meal., Disp: 180 tablet, Rfl: 1 .  cholecalciferol (VITAMIN D) 1000  units tablet, Take 1,000 Units by mouth daily., Disp: , Rfl:  .  ciprofloxacin (CIPRO) 500 MG tablet, Take 1 tablet (500 mg total) by mouth 2 (two) times daily., Disp: 10 tablet, Rfl: 0 .  fluticasone (FLONASE) 50 MCG/ACT nasal spray, Place 1 spray into both nostrils daily., Disp: , Rfl:  .   nitroGLYCERIN (NITROSTAT) 0.4 MG SL tablet, Place 1 tablet (0.4 mg total) under the tongue every 5 (five) minutes as needed for chest pain., Disp: 25 tablet, Rfl: 2 .  ticagrelor (BRILINTA) 90 MG TABS tablet, Take 1 tablet (90 mg total) by mouth 2 (two) times daily., Disp: 180 tablet, Rfl: 2 .  triamcinolone cream (KENALOG) 0.1 %, Apply 1 application topically 2 (two) times daily., Disp: 30 g, Rfl: 0  Allergies Patient has no known allergies.  Family History  Problem Relation Age of Onset  . Heart disease Father   . Heart attack Brother 30  . Breast cancer Neg Hx     Social History Social History  Substance Use Topics  . Smoking status: Never Smoker  . Smokeless tobacco: Never Used  . Alcohol use No    Review of Systems Constitutional: No fever/chills Cardiovascular: Denies chest pain. Respiratory: Denies shortness of breath. Gastrointestinal: No abdominal pain.   Musculoskeletal: Negative for back pain. Skin: As above.    ____________________________________________   PHYSICAL EXAM:  VITAL SIGNS: ED Triage Vitals  Enc Vitals Group     BP 12/04/16 2008 140/80     Pulse Rate 12/04/16 2008 60     Resp 12/04/16 2008 16     Temp 12/04/16 2008 98.4 F (36.9 C)     Temp Source 12/04/16 2008 Oral     SpO2 12/04/16 2008 100 %     Weight 12/04/16 2006 161 lb (73 kg)     Height 12/04/16 2006 5' 2.5" (1.588 m)     Head Circumference --      Peak Flow --      Pain Score 12/04/16 2006 2     Pain Loc --      Pain Edu? --      Excl. in [place]? --     Constitutional: Alert and oriented. Well appearing and in no acute distress. Cardiovascular: Normal rate, regular rhythm. Grossly normal heart sounds.  Good peripheral circulation. Respiratory: Normal respiratory effort without tachypnea nor retractions. Breath sounds are clear and equal bilaterally. No wheezes, rales, rhonchi. Musculoskeletal: Steady gait. See skin below.  Neurologic:  Normal speech and language. No gross focal  neurologic deficits are appreciated. Speech is normal. No gait instability.  Skin:  Skin is warm, dry.  Except: right midfoot plantar aspect less than 0.5 cm puncture wound noted with minimal tenderness to palpation, no fluctuance, no erythema, no drainage, no appearance of foreign body, no bony tenderness, right foot full range of motion present and normal distal capillary refill. Right foot otherwise nontender. Psychiatric: Mood and affect are normal. Speech and behavior are normal. Patient exhibits appropriate insight and judgment   ___________________________________________   LABS (all labs ordered are listed, but only abnormal results are displayed)  Labs Reviewed - No data to display ____________________________________________  RADIOLOGY  No results found.  declines ____________________________________________   PROCEDURES Procedures    INITIAL IMPRESSION / ASSESSMENT AND PLAN / ED COURSE  Pertinent labs & imaging results that were available during my care of the patient were reviewed by me and considered in my medical decision making (see chart for details).   Well  appearing patient. No erythema or drainage located at puncture wound site. Tetanus immunization updated. Discussed in detail with patient regarding wound monitoring, cleaning and evaluation. Patient declines any concern of retained foreign body, stating that the nail was intact. Patient denies concern of the foreign body as well as bony abnormality concerns, and declines x-ray evaluation of right foot. As patient punctured through rubber soled shoe, will treat patient with oral ciprofloxacin 5 days. Encouraged rest, fluids, topical antibiotic, and close monitoring. Discussed strict follow-up and monitoring.Discussed indication, risks and benefits of medications with patient, including tendon complications.  Discussed follow up with Primary care physician this week as needed. Discussed follow up and return  parameters including no resolution or any worsening concerns. Patient verbalized understanding and agreed to plan.   ____________________________________________   FINAL CLINICAL IMPRESSION(S) / ED DIAGNOSES  Final diagnoses:  Puncture wound of plantar aspect of right foot, initial encounter     Discharge Medication List as of 12/04/2016  8:58 PM    START taking these medications   Details  ciprofloxacin (CIPRO) 500 MG tablet Take 1 tablet (500 mg total) by mouth 2 (two) times daily., Starting Mon 12/04/2016, Until Sat 12/09/2016, Normal        Note: This dictation was prepared with Dragon dictation along with smaller phrase technology. Any transcriptional errors that result from this process are unintentional.         Marylene Land, NP 12/06/16 [phone]

## 2016-12-04 NOTE — Discharge Instructions (Signed)
Take medication as prescribed. Keep clean. Drink plenty of fluids.   Follow up with your primary care physician this week as needed. Return to Urgent care for redness, drainage, swelling, new or worsening concerns.

## 2016-12-05 NOTE — Progress Notes (Signed)
Cardiology Office Note   Date:  12/06/2016   ID:  Brenda Guerrero Mar 30, 1956, MRN [id number]  PCP:  Brenda Freshwater, NP  Cardiologist:  Dr. Martinique    Chief Complaint  Patient presents with  . Follow-up    6 months  . Coronary Artery Disease      History of Present Illness: Brenda Guerrero is a 61 y.o. female who presents for follow up CAD.   She has a history of obesity, HTN, HLD, diabetes, asthma and a family hx of CAD. She presented to [facility] on 05/28/15 as an anterior STEMI.  Underwent emergent cath and found to have single vessel obstructive CAD, DES to LAD.  Pk troponin 4. 30 minutes of arrival in the ICU the patient had marked increase in chest pain and hyperacute ST elevation consistent with reocclusion. She was taken back to the cath lab and found to have reocclusion of the vessel. She underwent repeat stenting of the LAD and PTCA for acute stent thrombosis. Her pain resolved post revascularization. EF normal by Echo and cath with early reperfusion.   Today she feels very well. She is frustrated with her elevated triglycerides and weight. She has really changed her diet. Last fasting blood sugar 113.  She admits she could exercise more but by her Fit bit is walking 8-10K steps a day. Notes her brother now has and LVAD and is listed for transplant for ischemic CM.  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      Past Medical History:  Diagnosis Date  . Asthma   . CAD in native artery   . Hyperlipidemia LDL goal <70   . Hypertension   . ST elevation (STEMI) myocardial infarction involving left anterior descending coronary artery ([place]) 05/28/15   with DES to LAD and return to cath lab for restenosis requiring PTCA    Past Surgical History:  Procedure Laterality Date  . CARDIAC CATHETERIZATION N/A 05/28/2015   Procedure: Left Heart Cath and Coronary Angiography;  Surgeon: Kristian Mogg M Martinique, MD;  Location: [place] [facility];  Service: Cardiovascular;   Laterality: N/A;  . CARDIAC CATHETERIZATION N/A 05/28/2015   Procedure: Coronary Stent Intervention;  Surgeon: Merlin Ege M Martinique, MD;  Location: [place] [facility];  Service: Cardiovascular;  Laterality: N/A;  . CARDIAC CATHETERIZATION N/A 05/28/2015   Procedure: Coronary Stent Intervention;  Surgeon: Michail Boyte M Martinique, MD;  Location: [place] [facility];  Service: Cardiovascular;  Laterality: N/A;  . CARDIAC CATHETERIZATION N/A 05/28/2015   Procedure: Coronary/Graft Angiography;  Surgeon: Aycen Porreca M Martinique, MD;  Location: [place] [facility];  Service: Cardiovascular;  Laterality: N/A;  . CARDIAC CATHETERIZATION N/A 05/28/2015   Procedure: Intravascular Ultrasound/IVUS;  Surgeon: Elmire Amrein M Martinique, MD;  Location: [place] [facility];  Service: Cardiovascular;  Laterality: N/A;  . TUBAL LIGATION       Current Outpatient Prescriptions  Medication Sig Dispense Refill  . alendronate (FOSAMAX) 70 MG tablet Take 1 tablet by mouth once a week.  1  . aspirin 81 MG chewable tablet Chew 1 tablet (81 mg total) by mouth daily.    Marland Kitchen atorvastatin (LIPITOR) 80 MG tablet Take 1 tablet (80 mg total) by mouth daily at 6 PM. 90 tablet 2  . calcium carbonate (OS-CAL) 1250 (500 Ca) MG chewable tablet Chew 1 tablet by mouth daily.    . carvedilol (COREG) 3.125 MG tablet Take 1 tablet (3.125 mg total) by mouth 2 (two) times daily with a meal. 180  tablet 1  . cholecalciferol (VITAMIN D) 1000 units tablet Take 1,000 Units by mouth daily.    . ciprofloxacin (CIPRO) 500 MG tablet Take 1 tablet (500 mg total) by mouth 2 (two) times daily. 10 tablet 0  . fluticasone (FLONASE) 50 MCG/ACT nasal spray Place 1 spray into both nostrils daily.    . nitroGLYCERIN (NITROSTAT) 0.4 MG SL tablet Place 1 tablet (0.4 mg total) under the tongue every 5 (five) minutes as needed for chest pain. 25 tablet 2  . Omega-3 Fatty Acids (FISH OIL) 1000 MG CPDR Take 2,000 mg by mouth daily.     No current facility-administered medications for this visit.      Allergies:   Patient has no known allergies.    Social History:  The patient  reports that she has never smoked. She has never used smokeless tobacco. She reports that she does not drink alcohol or use drugs.   Family History:  The patient's   family history includes Heart attack (age of onset: 4) in her brother; Heart disease in her father.    ROS:  As noted in HPI.  All other systems are reviewed and are negative.   Wt Readings from Last 3 Encounters:  12/06/16 162 lb (73.5 kg)  12/04/16 161 lb (73 kg)  04/08/16 152 lb (68.9 kg)     PHYSICAL EXAM: VS:  BP 116/80   Pulse 70   Ht 5' 2.5" (1.588 m)   Wt 162 lb (73.5 kg)   BMI 29.16 kg/m  , BMI Body mass index is 29.16 kg/m. General:Pleasant affect, NAD Skin:Warm and dry, brisk capillary refill HEENT:normocephalic, sclera clear, mucus membranes moist Neck:supple, no JVD, no bruits  Heart:S1S2 RRR without murmur, gallup, rub or click Lungs:clear without rales, rhonchi, or wheezes NID:POEU, non tender, + BS, do not palpate liver spleen or masses Ext:no lower ext edema, 2+ pedal pulses, 2+ radial pulses. Neuro:alert and oriented X 3, MAE, follows commands, + facial symmetry    EKG:  EKG is  ordered today. The ekg ordered today demonstrates NSR rate 70. Low voltage. No acute change. I have personally reviewed and interpreted this study.    Recent Labs: 12/04/2016: ALT 23; BUN 19; Creatinine, Ser 0.91; Hemoglobin 13.8; Platelets 243; Potassium 3.9; Sodium 138; TSH 3.252    Lipid Panel    Component Value Date/Time   CHOL 138 12/04/2016 0816   CHOL 145 03/15/2012 1036   TRIG 214 (H) 12/04/2016 0816   TRIG 209 (H) 03/15/2012 1036   HDL 30 (L) 12/04/2016 0816   HDL 23 (L) 03/15/2012 1036   CHOLHDL 4.6 12/04/2016 0816   VLDL 43 (H) 12/04/2016 0816   VLDL 42 (H) 03/15/2012 1036   LDLCALC 65 12/04/2016 0816   LDLCALC 80 03/15/2012 1036       Other studies Reviewed: Additional studies/ records that were reviewed  today include:. CARDIAC CATH:  Prox RCA to Mid RCA lesion, 10% stenosed.  The left ventricular systolic function is normal.  Ost LAD to Prox LAD lesion, 100% stenosed. Post intervention, there is a 0% residual stenosis.  1. Single vessel obstructive CAD 2. Normal LV function 3. Successful stenting of the proximal LAD with DES.   2D echo 05/29/15 Study Conclusions  - Left ventricle: The cavity size was normal. Wall thickness was normal. Systolic function was normal. The estimated ejection fraction was in the range of 55% to 60%. Wall motion was normal; there were no regional wall motion abnormalities. Doppler parameters are consistent with  abnormal left ventricular relaxation (grade 1 diastolic dysfunction).  Impressions:  - Normal LV systolic function; grade 1 diastolic dysfunction; trace MR and TR.  ASSESSMENT AND PLAN:  1.  Anterior STEMI s/p  DES to LAD 05/28/15.  Early reperfusion with normal EF. ASymptomatic.  Continue aggressive risk factor modification. OK to stop Brilinta now.  --follow up with me in 6 months. Continue ASA, Coreg, and statin.  2. CAD as in #1.  3. Hyperlipidemia excellent LDL control. Continue statin. For elevated triglycerides can increase fish oil to 4 grams daily. Continue dietary modification. Increase aerobic activity.  4. HTN- controlled on Coreg only. Continue  5. DM. Improved with dietary modification. A1c 6%.    Current medicines are reviewed with the patient today.  The patient Has no concerns regarding medicines.  The following changes have been made:  See above Labs/ tests ordered today include:see above  Disposition:   FU:  6 months  Signed, Karis Rilling Martinique, MD  12/06/2016 4:51 PM    [facility] [address] [place], [place] Phone: [phone]; Fax: [phone]

## 2016-12-06 ENCOUNTER — Encounter: Payer: Self-pay | Admitting: Cardiology

## 2016-12-06 ENCOUNTER — Ambulatory Visit (INDEPENDENT_AMBULATORY_CARE_PROVIDER_SITE_OTHER): Payer: Commercial Managed Care - PPO | Admitting: Cardiology

## 2016-12-06 VITALS — BP 116/80 | HR 70 | Ht 62.5 in | Wt 162.0 lb

## 2016-12-06 DIAGNOSIS — I2510 Atherosclerotic heart disease of native coronary artery without angina pectoris: Secondary | ICD-10-CM

## 2016-12-06 DIAGNOSIS — I10 Essential (primary) hypertension: Secondary | ICD-10-CM

## 2016-12-06 DIAGNOSIS — E785 Hyperlipidemia, unspecified: Secondary | ICD-10-CM

## 2016-12-06 LAB — VITAMIN D 25 HYDROXY (VIT D DEFICIENCY, FRACTURES): Vit D, 25-Hydroxy: 42.6 ng/mL (ref 30.0–100.0)

## 2016-12-06 MED ORDER — FISH OIL 1000 MG PO CPDR: 2000.0000 mg | DELAYED_RELEASE_CAPSULE | Freq: Every day | ORAL | Status: AC

## 2016-12-06 NOTE — Patient Instructions (Signed)
Stop taking Brilinta  Continue your other therapy  I will see you in 6 months.   

## 2017-01-31 ENCOUNTER — Other Ambulatory Visit: Payer: Self-pay | Admitting: Cardiology

## 2017-04-20 ENCOUNTER — Other Ambulatory Visit: Payer: Self-pay | Admitting: Internal Medicine

## 2017-04-26 ENCOUNTER — Other Ambulatory Visit: Payer: Self-pay | Admitting: Cardiology

## 2017-05-17 ENCOUNTER — Other Ambulatory Visit: Payer: Self-pay

## 2017-05-17 MED ORDER — FLUTICASONE PROPIONATE 50 MCG/ACT NA SUSP: 1.0000 | Freq: Every day | NASAL | 3 refills | Status: DC

## 2017-06-21 ENCOUNTER — Ambulatory Visit: Payer: Commercial Managed Care - PPO | Admitting: Nurse Practitioner

## 2017-06-21 ENCOUNTER — Encounter: Payer: Self-pay | Admitting: Nurse Practitioner

## 2017-06-21 VITALS — BP 133/90 | HR 74 | Resp 16 | Ht 62.5 in | Wt 166.8 lb

## 2017-06-21 DIAGNOSIS — I2510 Atherosclerotic heart disease of native coronary artery without angina pectoris: Secondary | ICD-10-CM | POA: Diagnosis not present

## 2017-06-21 DIAGNOSIS — I10 Essential (primary) hypertension: Secondary | ICD-10-CM

## 2017-06-21 DIAGNOSIS — E2839 Other primary ovarian failure: Secondary | ICD-10-CM

## 2017-06-21 DIAGNOSIS — D4862 Neoplasm of uncertain behavior of left breast: Secondary | ICD-10-CM

## 2017-06-21 DIAGNOSIS — Z1231 Encounter for screening mammogram for malignant neoplasm of breast: Secondary | ICD-10-CM

## 2017-06-21 DIAGNOSIS — Z1239 Encounter for other screening for malignant neoplasm of breast: Secondary | ICD-10-CM

## 2017-06-21 DIAGNOSIS — E785 Hyperlipidemia, unspecified: Secondary | ICD-10-CM | POA: Diagnosis not present

## 2017-06-21 NOTE — Progress Notes (Signed)
[facility] [place], [place] [address]  Internal MEDICINE  Office Visit Note  Patient Name: Brenda Guerrero  [id number]  [id number]  Date of Service: 07/11/2017  No chief complaint on file.   Hypertension  This is a chronic problem. The current episode started more than 1 year ago. The problem is unchanged. The problem is controlled. Associated symptoms include headaches. Pertinent negatives include no chest pain, neck pain, palpitations or shortness of breath. There are no associated agents to hypertension. Risk factors for coronary artery disease include dyslipidemia and post-menopausal state. Past treatments include beta blockers. The current treatment provides moderate improvement. There are no compliance problems.  Hypertensive end-organ damage includes CAD/MI.    Pt is here for routine follow up.    Current Medication: Outpatient Encounter Medications as of 06/21/2017  Medication Sig  . alendronate (FOSAMAX) 70 MG tablet TAKE 1 TABLET BY MOUTH ONCE WEEKLY FOR OSTEOPENIA  . aspirin 81 MG chewable tablet Chew 1 tablet (81 mg total) by mouth daily.  Marland Kitchen atorvastatin (LIPITOR) 80 MG tablet TAKE 1 TABLET (80 MG TOTAL) BY MOUTH DAILY AT 6 PM.  . calcium carbonate (OS-CAL) 1250 (500 Ca) MG chewable tablet Chew 1 tablet by mouth daily.  . carvedilol (COREG) 3.125 MG tablet TAKE 1 TABLET (3.125 MG TOTAL) BY MOUTH 2 (TWO) TIMES DAILY WITH A MEAL.  . cholecalciferol (VITAMIN D) 1000 units tablet Take 1,000 Units by mouth daily.  . fluticasone (FLONASE) 50 MCG/ACT nasal spray Place 1 spray into both nostrils daily.  Marland Kitchen ibuprofen (ADVIL,MOTRIN) 800 MG tablet Take by mouth.  . nitroGLYCERIN (NITROSTAT) 0.4 MG SL tablet Place 1 tablet (0.4 mg total) under the tongue every 5 (five) minutes as needed for chest pain.  . Omega-3 Fatty Acids (FISH OIL) 1000 MG CPDR Take 2,000 mg by mouth daily.  . Multiple Vitamin (MULTI-VITAMINS) TABS Take by mouth.   No  facility-administered encounter medications on file as of 06/21/2017.     Surgical History: Past Surgical History:  Procedure Laterality Date  . CARDIAC CATHETERIZATION N/A 05/28/2015   Procedure: Left Heart Cath and Coronary Angiography;  Surgeon: Peter M Martinique, MD;  Location: [place] [facility];  Service: Cardiovascular;  Laterality: N/A;  . CARDIAC CATHETERIZATION N/A 05/28/2015   Procedure: Coronary Stent Intervention;  Surgeon: Peter M Martinique, MD;  Location: [place] [facility];  Service: Cardiovascular;  Laterality: N/A;  . CARDIAC CATHETERIZATION N/A 05/28/2015   Procedure: Coronary Stent Intervention;  Surgeon: Peter M Martinique, MD;  Location: [place] [facility];  Service: Cardiovascular;  Laterality: N/A;  . CARDIAC CATHETERIZATION N/A 05/28/2015   Procedure: Coronary/Graft Angiography;  Surgeon: Peter M Martinique, MD;  Location: [place] [facility];  Service: Cardiovascular;  Laterality: N/A;  . CARDIAC CATHETERIZATION N/A 05/28/2015   Procedure: Intravascular Ultrasound/IVUS;  Surgeon: Peter M Martinique, MD;  Location: [place] [facility];  Service: Cardiovascular;  Laterality: N/A;  . TUBAL LIGATION      Medical History: Past Medical History:  Diagnosis Date  . Asthma   . CAD in native artery   . Hyperlipidemia LDL goal <70   . Hypertension   . ST elevation (STEMI) myocardial infarction involving left anterior descending coronary artery ([place]) 05/28/15   with DES to LAD and return to cath lab for restenosis requiring PTCA    Family History: Family History  Problem Relation Age of Onset  . Heart disease Father   . Heart attack Brother 94  . Breast cancer Neg  Hx     Social History   Socioeconomic History  . Marital status: Married    Spouse name: Not on file  . Number of children: Not on file  . Years of education: Not on file  . Highest education level: Not on file  Occupational History  . Not on file  Social Needs  . Financial resource strain: Not on file  . Food  insecurity:    Worry: Not on file    Inability: Not on file  . Transportation needs:    Medical: Not on file    Non-medical: Not on file  Tobacco Use  . Smoking status: Never Smoker  . Smokeless tobacco: Never Used  Substance and Sexual Activity  . Alcohol use: No  . Drug use: No  . Sexual activity: Yes    Birth control/protection: Post-menopausal  Lifestyle  . Physical activity:    Days per week: Not on file    Minutes per session: Not on file  . Stress: Not on file  Relationships  . Social connections:    Talks on phone: Not on file    Gets together: Not on file    Attends religious service: Not on file    Active member of club or organization: Not on file    Attends meetings of clubs or organizations: Not on file    Relationship status: Not on file  . Intimate partner violence:    Fear of current or ex partner: Not on file    Emotionally abused: Not on file    Physically abused: Not on file    Forced sexual activity: Not on file  Other Topics Concern  . Not on file  Social History Narrative  . Not on file      Review of Systems  Constitutional: Negative for activity change, chills, fatigue and unexpected weight change.  HENT: Negative for congestion, postnasal drip, rhinorrhea, sneezing and sore throat.   Eyes: Negative.  Negative for redness.  Respiratory: Negative for cough, chest tightness, shortness of breath and wheezing.   Cardiovascular: Negative for chest pain and palpitations.  Gastrointestinal: Negative for abdominal pain, constipation, diarrhea, nausea and vomiting.  Endocrine: Negative for cold intolerance, heat intolerance, polydipsia, polyphagia and polyuria.  Genitourinary: Negative for dysuria and frequency.  Musculoskeletal: Negative for arthralgias, back pain, joint swelling and neck pain.  Skin: Negative for rash.  Allergic/Immunologic: Negative for environmental allergies.  Neurological: Positive for headaches. Negative for tremors and  numbness.  Hematological: Negative for adenopathy. Does not bruise/bleed easily.  Psychiatric/Behavioral: Negative for behavioral problems (Depression), sleep disturbance and suicidal ideas. The patient is not nervous/anxious.     Today's Vitals   06/21/17 1606  BP: 133/90  Pulse: 74  Resp: 16  SpO2: 97%  Weight: 166 lb 12.8 oz (75.7 kg)  Height: 5' 2.5" (1.588 m)    Physical Exam  Constitutional: She is oriented to person, place, and time. She appears well-developed and well-nourished. No distress.  HENT:  Head: Normocephalic and atraumatic.  Mouth/Throat: Oropharynx is clear and moist. No oropharyngeal exudate.  Eyes: Pupils are equal, round, and reactive to light. EOM are normal.  Neck: Normal range of motion. Neck supple. No JVD present. Carotid bruit is not present. No tracheal deviation present. No thyromegaly present.  Cardiovascular: Normal rate, regular rhythm and normal heart sounds. Exam reveals no gallop and no friction rub.  No murmur heard. Pulmonary/Chest: Effort normal and breath sounds normal. No respiratory distress. She has no wheezes. She has no  rales. She exhibits no tenderness.  Abdominal: Soft. Bowel sounds are normal. There is no tenderness.  Musculoskeletal: Normal range of motion.  Lymphadenopathy:    She has no cervical adenopathy.  Neurological: She is alert and oriented to person, place, and time. No cranial nerve deficit.  Skin: Skin is warm and dry. She is not diaphoretic.  Psychiatric: She has a normal mood and affect. Her behavior is normal. Judgment and thought content normal.  Nursing note and vitals reviewed.   Assessment/Plan: 1. Essential hypertension Stable. Continue bp medication s prescribed.   2. Coronary artery disease involving native coronary artery of native heart without angina pectoris diong well. Regular visits with cardiology as scheduled.   3. Dyslipidemia Continue statin as prescribed.   4. Neoplasm of uncertain behavior  of breast, left Will get follow up 6 month ultrasound of breast.  - US BREAST LTD UNI LEFT INC AXILLA; Future - MM DIAG BREAST TOMO BILATERAL; Future  5. Screening for breast cancer - MM DIAG BREAST TOMO BILATERAL; Future  6. Ovarian failure - DG Bone Density; Future  General Counseling: patryce depriest understanding of the findings of todays visit and agrees with plan of treatment. I have discussed any further diagnostic evaluation that may be needed or ordered today. We also reviewed her medications today. she has been encouraged to call the office with any questions or concerns that should arise related to todays visit.  This patient was seen by Leretha Pol, FNP- C in Collaboration with Dr Lavera Guise as a part of collaborative care agreement    Orders Placed This Encounter  Procedures  . US BREAST LTD UNI LEFT INC AXILLA  . DG Bone Density  . MM DIAG BREAST TOMO BILATERAL     Time spent: 14  Minutes     Dr Lavera Guise Internal medicine

## 2017-07-11 DIAGNOSIS — E2839 Other primary ovarian failure: Secondary | ICD-10-CM | POA: Insufficient documentation

## 2017-07-11 DIAGNOSIS — D4861 Neoplasm of uncertain behavior of right breast: Secondary | ICD-10-CM | POA: Insufficient documentation

## 2017-07-11 DIAGNOSIS — Z0001 Encounter for general adult medical examination with abnormal findings: Secondary | ICD-10-CM | POA: Insufficient documentation

## 2017-07-12 ENCOUNTER — Other Ambulatory Visit: Payer: Commercial Managed Care - PPO

## 2017-07-13 ENCOUNTER — Ambulatory Visit
Admission: RE | Admit: 2017-07-13 | Discharge: 2017-07-13 | Disposition: A | Discharge status: Home / Self Care | Payer: Commercial Managed Care - PPO | Source: Ambulatory Visit | Attending: Nurse Practitioner | Admitting: Nurse Practitioner

## 2017-07-13 DIAGNOSIS — Z1231 Encounter for screening mammogram for malignant neoplasm of breast: Secondary | ICD-10-CM | POA: Diagnosis present

## 2017-07-13 DIAGNOSIS — D4862 Neoplasm of uncertain behavior of left breast: Secondary | ICD-10-CM

## 2017-07-13 DIAGNOSIS — Z1239 Encounter for other screening for malignant neoplasm of breast: Secondary | ICD-10-CM

## 2017-08-09 ENCOUNTER — Telehealth: Payer: Self-pay | Admitting: Nurse Practitioner

## 2017-08-09 ENCOUNTER — Other Ambulatory Visit: Payer: Self-pay | Admitting: Nurse Practitioner

## 2017-08-09 MED ORDER — AMOXICILLIN 875 MG PO TABS: 875.0000 mg | ORAL_TABLET | Freq: Two times a day (BID) | ORAL | 0 refills | Status: DC

## 2017-08-09 NOTE — Telephone Encounter (Signed)
Patient c/o cough, sinus congestion, post nasal drip. Added amoxicillin 875mg  twice daily for 10 days. Continue allegra. Use flonase nasal spray. Use mucinex and OTC medications to treat symptoms.

## 2017-08-09 NOTE — Progress Notes (Signed)
Patient c/o cough, sinus congestion, post nasal drip. Added amoxicillin 875mg  twice daily for 10 days. Continue allegra. Use flonase nasal spray. Use mucinex and OTC medications to treat symptoms.

## 2017-08-22 ENCOUNTER — Other Ambulatory Visit: Payer: Self-pay

## 2017-08-22 MED ORDER — FLUTICASONE PROPIONATE 50 MCG/ACT NA SUSP: 1.0000 | Freq: Every day | NASAL | 3 refills | Status: DC

## 2017-09-05 ENCOUNTER — Other Ambulatory Visit: Payer: Commercial Managed Care - PPO

## 2017-10-03 ENCOUNTER — Other Ambulatory Visit: Payer: Self-pay

## 2017-10-03 MED ORDER — ALENDRONATE SODIUM 70 MG PO TABS: ORAL_TABLET | ORAL | 1 refills | Status: DC

## 2017-10-25 ENCOUNTER — Ambulatory Visit
Admission: RE | Admit: 2017-10-25 | Discharge: 2017-10-25 | Disposition: A | Discharge status: Home / Self Care | Payer: Commercial Managed Care - PPO | Source: Ambulatory Visit | Attending: Nurse Practitioner | Admitting: Nurse Practitioner

## 2017-10-25 DIAGNOSIS — E2839 Other primary ovarian failure: Secondary | ICD-10-CM | POA: Diagnosis present

## 2017-12-28 ENCOUNTER — Other Ambulatory Visit
Admission: RE | Admit: 2017-12-28 | Discharge: 2017-12-28 | Disposition: A | Discharge status: Home / Self Care | Payer: Commercial Managed Care - PPO | Source: Ambulatory Visit | Attending: Nurse Practitioner | Admitting: Nurse Practitioner

## 2017-12-28 DIAGNOSIS — I10 Essential (primary) hypertension: Secondary | ICD-10-CM | POA: Insufficient documentation

## 2017-12-28 LAB — COMPREHENSIVE METABOLIC PANEL
ALBUMIN: 4 g/dL (ref 3.5–5.0)
ALT: 29 U/L (ref 0–44)
AST: 29 U/L (ref 15–41)
Alkaline Phosphatase: 93 U/L (ref 38–126)
Anion gap: 8 (ref 5–15)
BUN: 20 mg/dL (ref 6–20)
CHLORIDE: 105 mmol/L (ref 98–111)
CO2: 26 mmol/L (ref 22–32)
Calcium: 8.7 mg/dL — ABNORMAL LOW (ref 8.9–10.3)
Creatinine, Ser: 0.67 mg/dL (ref 0.44–1.00)
GFR calc Af Amer: >60 mL/min (ref >60)
Glucose, Bld: 118 mg/dL — ABNORMAL HIGH (ref 70–99)
POTASSIUM: 4 mmol/L (ref 3.5–5.1)
Sodium: 139 mmol/L (ref 135–145)
Total Bilirubin: 0.6 mg/dL (ref 0.3–1.2)
Total Protein: 7.5 g/dL (ref 6.5–8.1)

## 2017-12-28 LAB — HEMOGLOBIN A1C
HEMOGLOBIN A1C: 6.3 % — AB (ref 4.8–5.6)
Mean Plasma Glucose: 134.11 mg/dL

## 2017-12-28 LAB — CBC WITH DIFFERENTIAL/PLATELET
BASOS ABS: 0 10*3/uL (ref 0–0.1)
BASOS PCT: 1 %
EOS PCT: 2 %
Eosinophils Absolute: 0.1 10*3/uL (ref 0–0.7)
HCT: 40.5 % (ref 35.0–47.0)
Hemoglobin: 13.8 g/dL (ref 12.0–16.0)
Lymphocytes Relative: 34 %
Lymphs Abs: 2.2 10*3/uL (ref 1.0–3.6)
MCH: 31.1 pg (ref 26.0–34.0)
MCHC: 34 g/dL (ref 32.0–36.0)
MCV: 91.6 fL (ref 80.0–100.0)
MONO ABS: 0.5 10*3/uL (ref 0.2–0.9)
Monocytes Relative: 8 %
Neutro Abs: 3.7 10*3/uL (ref 1.4–6.5)
Neutrophils Relative %: 55 %
PLATELETS: 235 10*3/uL (ref 150–440)
RBC: 4.42 MIL/uL (ref 3.80–5.20)
RDW: 13.9 % (ref 11.5–14.5)
WBC: 6.5 10*3/uL (ref 3.6–11.0)

## 2017-12-28 LAB — T4, FREE: Free T4: 0.81 ng/dL — ABNORMAL LOW (ref 0.82–1.77)

## 2017-12-28 LAB — LIPID PANEL
CHOLESTEROL: 184 mg/dL (ref 0–200)
HDL: 30 mg/dL — AB (ref >40)
LDL CALC: 115 mg/dL — AB (ref 0–99)
TRIGLYCERIDES: 197 mg/dL — AB (ref <150)
Total CHOL/HDL Ratio: 6.1 RATIO
VLDL: 39 mg/dL (ref 0–40)

## 2017-12-28 LAB — TSH: TSH: 1.923 u[IU]/mL (ref 0.350–4.500)

## 2017-12-29 LAB — VITAMIN D 25 HYDROXY (VIT D DEFICIENCY, FRACTURES): Vit D, 25-Hydroxy: 39.8 ng/mL (ref 30.0–100.0)

## 2018-01-03 ENCOUNTER — Ambulatory Visit: Payer: Commercial Managed Care - PPO | Admitting: Nurse Practitioner

## 2018-01-03 ENCOUNTER — Encounter: Payer: Self-pay | Admitting: Nurse Practitioner

## 2018-01-03 VITALS — BP 136/80 | HR 61 | Resp 16 | Ht 62.0 in | Wt 169.0 lb

## 2018-01-03 DIAGNOSIS — Z124 Encounter for screening for malignant neoplasm of cervix: Secondary | ICD-10-CM | POA: Diagnosis not present

## 2018-01-03 DIAGNOSIS — Z0001 Encounter for general adult medical examination with abnormal findings: Secondary | ICD-10-CM | POA: Diagnosis not present

## 2018-01-03 DIAGNOSIS — I10 Essential (primary) hypertension: Secondary | ICD-10-CM | POA: Diagnosis not present

## 2018-01-03 DIAGNOSIS — D235 Other benign neoplasm of skin of trunk: Secondary | ICD-10-CM | POA: Insufficient documentation

## 2018-01-03 DIAGNOSIS — M810 Age-related osteoporosis without current pathological fracture: Secondary | ICD-10-CM | POA: Insufficient documentation

## 2018-01-03 DIAGNOSIS — I2510 Atherosclerotic heart disease of native coronary artery without angina pectoris: Secondary | ICD-10-CM

## 2018-01-03 DIAGNOSIS — E785 Hyperlipidemia, unspecified: Secondary | ICD-10-CM | POA: Diagnosis not present

## 2018-01-03 DIAGNOSIS — R300 Dysuria: Secondary | ICD-10-CM

## 2018-01-03 DIAGNOSIS — J300 Vasomotor rhinitis: Secondary | ICD-10-CM

## 2018-01-03 MED ORDER — ALENDRONATE SODIUM 70 MG PO TABS: ORAL_TABLET | ORAL | 4 refills | Status: DC

## 2018-01-03 MED ORDER — FLUTICASONE PROPIONATE 50 MCG/ACT NA SUSP: 1.0000 | Freq: Every day | NASAL | 4 refills | Status: DC

## 2018-01-03 MED ORDER — ROSUVASTATIN CALCIUM 10 MG PO TABS: 10.0000 mg | ORAL_TABLET | Freq: Every day | ORAL | 4 refills | Status: DC

## 2018-01-03 NOTE — Progress Notes (Signed)
[facility], [place] [address]  Internal MEDICINE  Office Visit Note  Patient Name: Brenda Guerrero  [id number]  [id number]  Date of Service: 01/03/2018   Pt is here for routine health maintenance examination   Chief Complaint  Patient presents with  . Annual Exam    pt has concerns about lipitor, it makes her feel funny  . Gynecologic Exam     The patient states that she stopped taking her lipitor back in July. Was on 42m every day after having a mild heart attack. Feels like it was causing her to have "fogginess" in her head. Was very tired and had some joint pain. She is currently taking red yeast rice supplement to help keep her cholesterol low, naturally. Her labs were recently drawn and her cholesterol is elevated from last year. She is feeling well. No concerns or complaints to discuss.    Current Medication: Outpatient Encounter Medications as of 01/03/2018  Medication Sig  . alendronate (FOSAMAX) 70 MG tablet TAKE 1 TABLET BY MOUTH ONCE WEEKLY FOR OSTEOPENIA  . aspirin 81 MG chewable tablet Chew 1 tablet (81 mg total) by mouth daily.  . calcium carbonate (OS-CAL) 1250 (500 Ca) MG chewable tablet Chew 1 tablet by mouth daily.  . carvedilol (COREG) 3.125 MG tablet TAKE 1 TABLET (3.125 MG TOTAL) BY MOUTH 2 (TWO) TIMES DAILY WITH A MEAL.  . cholecalciferol (VITAMIN D) 1000 units tablet Take 1,000 Units by mouth daily.  . fluticasone (FLONASE) 50 MCG/ACT nasal spray Place 1 spray into both nostrils daily.  .Marland Kitchenibuprofen (ADVIL,MOTRIN) 800 MG tablet Take by mouth.  . Multiple Vitamin (MULTI-VITAMINS) TABS Take by mouth.  . nitroGLYCERIN (NITROSTAT) 0.4 MG SL tablet Place 1 tablet (0.4 mg total) under the tongue every 5 (five) minutes as needed for chest pain.  . Omega-3 Fatty Acids (FISH OIL) 1000 MG CPDR Take 2,000 mg by mouth daily.  . [DISCONTINUED] alendronate (FOSAMAX) 70 MG tablet TAKE 1 TABLET BY MOUTH ONCE WEEKLY FOR OSTEOPENIA    . [DISCONTINUED] atorvastatin (LIPITOR) 80 MG tablet TAKE 1 TABLET (80 MG TOTAL) BY MOUTH DAILY AT 6 PM.  . [DISCONTINUED] fluticasone (FLONASE) 50 MCG/ACT nasal spray Place 1 spray into both nostrils daily.  . rosuvastatin (CRESTOR) 10 MG tablet Take 1 tablet (10 mg total) by mouth daily.  . [DISCONTINUED] amoxicillin (AMOXIL) 875 MG tablet Take 1 tablet (875 mg total) by mouth 2 (two) times daily. (Patient not taking: Reported on 01/03/2018)   No facility-administered encounter medications on file as of 01/03/2018.     Surgical History: Past Surgical History:  Procedure Laterality Date  . CARDIAC CATHETERIZATION N/A 05/28/2015   Procedure: Left Heart Cath and Coronary Angiography;  Surgeon: Peter M JMartinique MD;  [facility];  Service: Cardiovascular;  Laterality: N/A;  . CARDIAC CATHETERIZATION N/A 05/28/2015   Procedure: Coronary Stent Intervention;  Surgeon: Peter M JMartinique MD;  [facility];  Service: Cardiovascular;  Laterality: N/A;  . CARDIAC CATHETERIZATION N/A 05/28/2015   Procedure: Coronary Stent Intervention;  Surgeon: Peter M JMartinique MD;  [facility];  Service: Cardiovascular;  Laterality: N/A;  . CARDIAC CATHETERIZATION N/A 05/28/2015   Procedure: Coronary/Graft Angiography;  Surgeon: Peter M JMartinique MD;  [facility];  Service: Cardiovascular;  Laterality: N/A;  . CARDIAC CATHETERIZATION N/A 05/28/2015   Procedure: Intravascular Ultrasound/IVUS;  Surgeon: Peter M JMartinique MD;  [facility];  Service: Cardiovascular;  Laterality: N/A;  . TUBAL LIGATION      Medical History: Past Medical History:  Diagnosis Date  . Asthma   . CAD in native artery   . Hyperlipidemia LDL goal <70   . Hypertension   . ST elevation (STEMI) myocardial infarction involving left anterior descending coronary artery ([place]) 05/28/15   with DES to LAD and return to cath lab for restenosis requiring PTCA    Family  History: Family History  Problem Relation Age of Onset  . Heart disease Father   . Heart attack Brother 86  . Breast cancer Neg Hx       Review of Systems  Constitutional: Negative for activity change, chills, fatigue and unexpected weight change.  HENT: Negative for congestion, postnasal drip, rhinorrhea, sneezing and sore throat.   Eyes: Negative.  Negative for redness.  Respiratory: Negative for cough, chest tightness, shortness of breath and wheezing.   Cardiovascular: Negative for chest pain and palpitations.  Gastrointestinal: Negative for abdominal pain, constipation, diarrhea, nausea and vomiting.  Endocrine: Negative for cold intolerance, heat intolerance, polydipsia, polyphagia and polyuria.  Genitourinary: Negative for dysuria and frequency.  Musculoskeletal: Negative for arthralgias, back pain, joint swelling and neck pain.  Skin: Negative for rash.       Has a mole on her abdomen she would like to have evaluated.   Allergic/Immunologic: Negative for environmental allergies.  Neurological: Positive for headaches. Negative for tremors and numbness.  Hematological: Negative for adenopathy. Does not bruise/bleed easily.  Psychiatric/Behavioral: Negative for behavioral problems (Depression), sleep disturbance and suicidal ideas. The patient is not nervous/anxious.      Today's Vitals   01/03/18 0932  BP: 136/80  Pulse: 61  Resp: 16  SpO2: 97%  Weight: 169 lb (76.7 kg)  Height: 5' 2"  (1.575 m)   Physical Exam  Constitutional: She is oriented to person, place, and time. She appears well-developed and well-nourished. No distress.  HENT:  Head: Normocephalic and atraumatic.  Nose: Nose normal.  Mouth/Throat: Oropharynx is clear and moist. No oropharyngeal exudate.  Eyes: Pupils are equal, round, and reactive to light. Conjunctivae and EOM are normal.  Neck: Normal range of motion. Neck supple. No JVD present. Carotid bruit is not present. No tracheal deviation  present. No thyromegaly present.  Cardiovascular: Normal rate, regular rhythm, normal heart sounds and intact distal pulses. Exam reveals no gallop and no friction rub.  No murmur heard. Pulmonary/Chest: Effort normal and breath sounds normal. No respiratory distress. She has no wheezes. She has no rales. She exhibits no tenderness. Right breast exhibits no inverted nipple, no mass, no nipple discharge, no skin change and no tenderness. Left breast exhibits no inverted nipple, no mass, no nipple discharge, no skin change and no tenderness.  Abdominal: Soft. Bowel sounds are normal. There is no tenderness.  Genitourinary: Vagina normal and uterus normal.  Genitourinary Comments: No tenderness, masses, or organomeglay present during bimanual exam .  Musculoskeletal: Normal range of motion.  Lymphadenopathy:    She has no cervical adenopathy.  Neurological: She is alert and oriented to person, place, and time. No cranial nerve deficit.  Skin: Skin is warm and dry. Capillary refill takes less than 2 seconds. She is not diaphoretic.     Psychiatric: She has a normal mood and affect. Her behavior is normal. Judgment and thought content normal.  Nursing note and vitals reviewed.    LABS: Recent Results (from the past 2160 hour(s))  CBC with Differential/Platelet     Status: None  Collection Time: 12/28/17  7:59 AM  Result Value Ref Range   WBC 6.5 3.6 - 11.0 K/uL   RBC 4.42 3.80 - 5.20 MIL/uL   Hemoglobin 13.8 12.0 - 16.0 g/dL   HCT 40.5 35.0 - 47.0 %   MCV 91.6 80.0 - 100.0 fL   MCH 31.1 26.0 - 34.0 pg   MCHC 34.0 32.0 - 36.0 g/dL   RDW 13.9 11.5 - 14.5 %   Platelets 235 150 - 440 K/uL   Neutrophils Relative % 55 %   Neutro Abs 3.7 1.4 - 6.5 K/uL   Lymphocytes Relative 34 %   Lymphs Abs 2.2 1.0 - 3.6 K/uL   Monocytes Relative 8 %   Monocytes Absolute 0.5 0.2 - 0.9 K/uL   Eosinophils Relative 2 %   Eosinophils Absolute 0.1 0 - 0.7 K/uL   Basophils Relative 1 %   Basophils Absolute  0.0 0 - 0.1 K/uL    Comment: Performed at [facility], [address] [place], [place] [address]  Comprehensive metabolic panel     Status: Abnormal   Collection Time: 12/28/17  7:59 AM  Result Value Ref Range   Sodium 139 135 - 145 mmol/L   Potassium 4.0 3.5 - 5.1 mmol/L   Chloride 105 98 - 111 mmol/L   CO2 26 22 - 32 mmol/L   Glucose, Bld 118 (H) 70 - 99 mg/dL   BUN 20 6 - 20 mg/dL   Creatinine, Ser 0.67 0.44 - 1.00 mg/dL   Calcium 8.7 (L) 8.9 - 10.3 mg/dL   Total Protein 7.5 6.5 - 8.1 g/dL   Albumin 4.0 3.5 - 5.0 g/dL   AST 29 15 - 41 U/L   ALT 29 0 - 44 U/L   Alkaline Phosphatase 93 38 - 126 U/L   Total Bilirubin 0.6 0.3 - 1.2 mg/dL   GFR calc non Af Amer >60 >60 mL/min   GFR calc Af Amer >60 >60 mL/min    Comment: (NOTE) The eGFR has been calculated using the CKD EPI equation. This calculation has not been validated in all clinical situations. eGFR's persistently <60 mL/min signify possible Chronic Kidney Disease.    Anion gap 8 5 - 15    Comment: Performed at [facility], [place], [place] [address]  Hemoglobin A1c     Status: Abnormal   Collection Time: 12/28/17  7:59 AM  Result Value Ref Range   Hgb A1c MFr Bld 6.3 (H) 4.8 - 5.6 %    Comment: (NOTE) Pre diabetes:          5.7%-6.4% Diabetes:              >6.4% Glycemic control for   <7.0% adults with diabetes    Mean Plasma Glucose 134.11 mg/dL    Comment: Performed at [facility] [address], [place], [place] [address]  Lipid panel     Status: Abnormal   Collection Time: 12/28/17  7:59 AM  Result Value Ref Range   Cholesterol 184 0 - 200 mg/dL   Triglycerides 197 (H) <150 mg/dL   HDL 30 (L) >40 mg/dL   Total CHOL/HDL Ratio 6.1 RATIO   VLDL 39 0 - 40 mg/dL   LDL Cholesterol 115 (H) 0 - 99 mg/dL    Comment:        Total Cholesterol/HDL:CHD Risk Coronary Heart Disease Risk Table  Men   Women  1/2 Average Risk   3.4   3.3  Average Risk        5.0   4.4  2 X Average Risk   9.6   7.1  3 X Average Risk  23.4   11.0        Use the calculated Patient Ratio above and the CHD Risk Table to determine the patient's CHD Risk.        ATP III CLASSIFICATION (LDL):  <100     mg/dL   Optimal  100-129  mg/dL   Near or Above                    Optimal  130-159  mg/dL   Borderline  160-189  mg/dL   High  >190     mg/dL   Very High Performed at [facility], [address] [place], [place] [address]   TSH     Status: None   Collection Time: 12/28/17  7:59 AM  Result Value Ref Range   TSH 1.923 0.350 - 4.500 uIU/mL    Comment: Performed by a 3rd Generation assay with a functional sensitivity of <=0.01 uIU/mL. Performed at [facility], [address] [place], [place] [address]   T4, free     Status: Abnormal   Collection Time: 12/28/17  7:59 AM  Result Value Ref Range   Free T4 0.81 (L) 0.82 - 1.77 ng/dL    Comment: (NOTE) Biotin ingestion may interfere with free T4 tests. If the results are inconsistent with the TSH level, previous test results, or the clinical presentation, then consider biotin interference. If needed, order repeat testing after stopping biotin. Performed at [facility], [address] [place], [place] [address]   VITAMIN D 25 Hydroxy (Vit-D Deficiency, Fractures)     Status: None   Collection Time: 12/28/17  7:59 AM  Result Value Ref Range   Vit D, 25-Hydroxy 39.8 30.0 - 100.0 ng/mL    Comment: (NOTE) Vitamin D deficiency has been defined by the [place] practice guideline as a level of serum 25-OH vitamin D less than 20 ng/mL (1,2). The Endocrine Society went on to further define vitamin D insufficiency as a level between 21 and 29 ng/mL (2). 1. IOM (Institute of Medicine). 2010. Dietary reference   intakes for calcium and D. [place]: The   [employer]. 2. Holick MF, Binkley Darke, Bischoff-Ferrari HA, et al.    Evaluation, treatment, and prevention of vitamin D   deficiency: an Endocrine Society clinical practice   guideline. JCEM. 2011 Jul; 96(7):1911-30. Performed At: [facility] [place], [place] [id number] Rush Farmer MD PT:[id number]     Assessment/Plan:  1. Encounter for general adult medical examination with abnormal findings Annual wellness visit today with pap smear.   2. Dyslipidemia Start rosuvastatin 68m daily. Reviewed heart healthy diet. Will recheck in apprx 6 months.  - rosuvastatin (CRESTOR) 10 MG tablet; Take 1 tablet (10 mg total) by mouth daily.  Dispense: 90 tablet; Refill: 4  3. Essential hypertension Stable. Continue bp medication as prescribed.   4. Age-related osteoporosis without current pathological fracture Reviewed bone density test. Shows generalized osteopenia. Will continue fosamax once weekly. Repeat test in 2 years for further evaluation.  - alendronate (FOSAMAX) 70 MG tablet; TAKE 1 TABLET BY MOUTH ONCE WEEKLY FOR OSTEOPENIA  Dispense: 12 tablet; Refill: 4  5. Vasomotor rhinitis - fluticasone (  FLONASE) 50 MCG/ACT nasal spray; Place 1 spray into both nostrils daily.  Dispense: 48 g; Refill: 4  6. Screening for malignant neoplasm of cervix - Pap IG and HPV (high risk) DNA detection  7. Coronary artery disease involving native coronary artery of native heart without angina pectoris Continue regular visits with cardiology as scheduled.   8. Benign neoplasm of skin of abdomen Small, benign appearing lesion in center of abdomen, just below sternum. Will monitor. Refer to dermatology as indicated.   9. Dysuria - UA/M w/rflx Culture, Routine   General Counseling: Bari verbalizes understanding of the findings of todays visit and agrees with plan of treatment. I have discussed any further diagnostic evaluation that may be needed or ordered today. We also reviewed her medications today. she has been encouraged to call the office  with any questions or concerns that should arise related to todays visit.    Counseling:  Hypertension Counseling:   The following hypertensive lifestyle modification were recommended and discussed:  1. Limiting alcohol intake to less than 1 oz/day of ethanol:(24 oz of beer or 8 oz of wine or 2 oz of 100-proof whiskey). 2. Take baby ASA 81 mg daily. 3. Importance of regular aerobic exercise and losing weight. 4. Reduce dietary saturated fat and cholesterol intake for overall cardiovascular health. 5. Maintaining adequate dietary potassium, calcium, and magnesium intake. 6. Regular monitoring of the blood pressure. 7. Reduce sodium intake to less than 100 mmol/day (less than 2.3 gm of sodium or less than 6 gm of sodium choride)   This patient was seen by [facility] with Dr Lavera Guise as a part of collaborative care agreement  Orders Placed This Encounter  Procedures  . UA/M w/rflx Culture, Routine    Meds ordered this encounter  Medications  . rosuvastatin (CRESTOR) 10 MG tablet    Sig: Take 1 tablet (10 mg total) by mouth daily.    Dispense:  90 tablet    Refill:  4    Order Specific Question:   Supervising Provider    Answer:   Lavera Guise [[id number]]  . fluticasone (FLONASE) 50 MCG/ACT nasal spray    Sig: Place 1 spray into both nostrils daily.    Dispense:  48 g    Refill:  4    Order Specific Question:   Supervising Provider    Answer:   Lavera Guise [[id number]]  . alendronate (FOSAMAX) 70 MG tablet    Sig: TAKE 1 TABLET BY MOUTH ONCE WEEKLY FOR OSTEOPENIA    Dispense:  12 tablet    Refill:  4    Order Specific Question:   Supervising Provider    Answer:   Lavera Guise [[id number]]    Time spent: [place], MD  Internal Medicine

## 2018-01-04 LAB — UA/M W/RFLX CULTURE, ROUTINE
Bilirubin, UA: NEGATIVE
GLUCOSE, UA: NEGATIVE
KETONES UA: NEGATIVE
Leukocytes, UA: NEGATIVE
Nitrite, UA: NEGATIVE
PROTEIN UA: NEGATIVE
RBC, UA: NEGATIVE
UUROB: 0.2 mg/dL (ref 0.2–1.0)
pH, UA: 5.5 (ref 5.0–7.5)

## 2018-01-04 LAB — MICROSCOPIC EXAMINATION
Bacteria, UA: NONE SEEN
Casts: NONE SEEN /lpf
Epithelial Cells (non renal): NONE SEEN /hpf (ref 0–10)
WBC UA: NONE SEEN /HPF (ref 0–5)

## 2018-01-07 ENCOUNTER — Telehealth: Payer: Self-pay | Admitting: Internal Medicine

## 2018-01-07 LAB — PAP IG AND HPV HIGH-RISK
HPV, HIGH-RISK: NEGATIVE
PAP SMEAR COMMENT: 0

## 2018-01-07 NOTE — Telephone Encounter (Signed)
FAXED [email or web address]

## 2018-01-17 ENCOUNTER — Other Ambulatory Visit: Payer: Self-pay | Admitting: Cardiology

## 2018-02-12 ENCOUNTER — Other Ambulatory Visit: Payer: Self-pay | Admitting: Cardiology

## 2018-03-22 IMAGING — MG MM DIGITAL DIAGNOSTIC BILAT W/ TOMO W/ CAD
8 of 18 series · 8 of 38 positions shown · non-contrast
Comparison: Mammography 06/20/2016, 05/10/2015 and earlier.

CLINICAL DATA: Recall from screening mammography with
tomosynthesis, possible mass or focal asymmetry in the upper right
breast visualized only on the MLO view and possible focal asymmetry
in the upper left breast at far posterior depth visualized on both
views.

EXAM:
2D DIGITAL DIAGNOSTIC BILATERAL MAMMOGRAM WITH CAD AND ADJUNCT TOMO
ULTRASOUND LEFT BREAST

[R ML synth-2D]
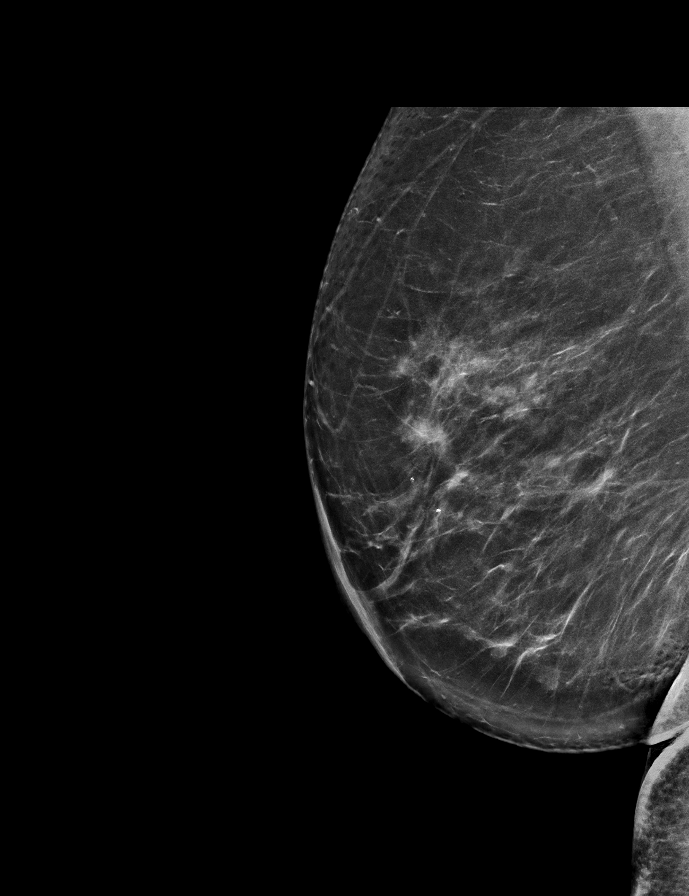

[L MLO]
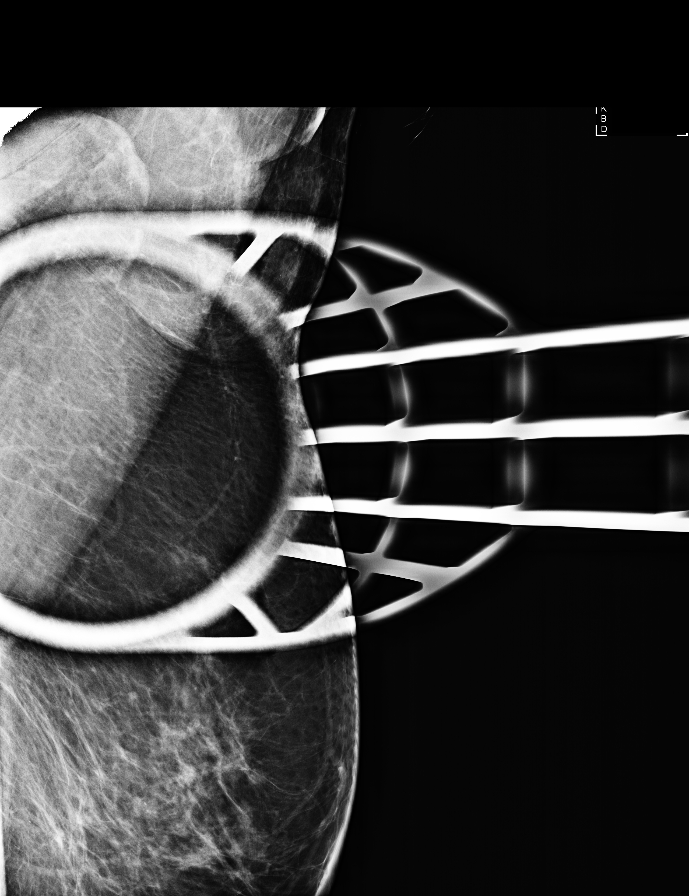

[L MLO synth-2D (1 of 2)]
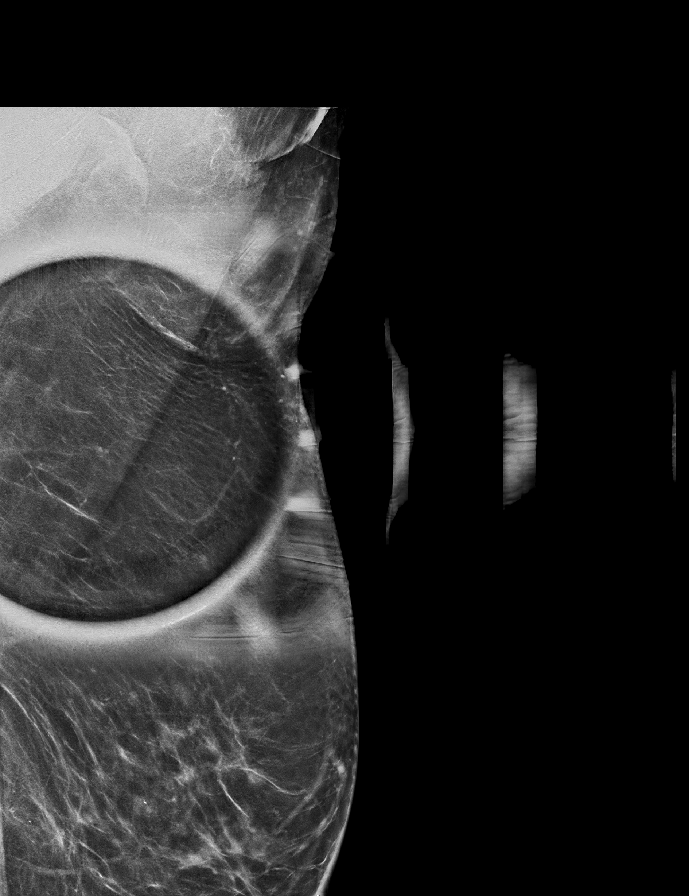

[R MLO]
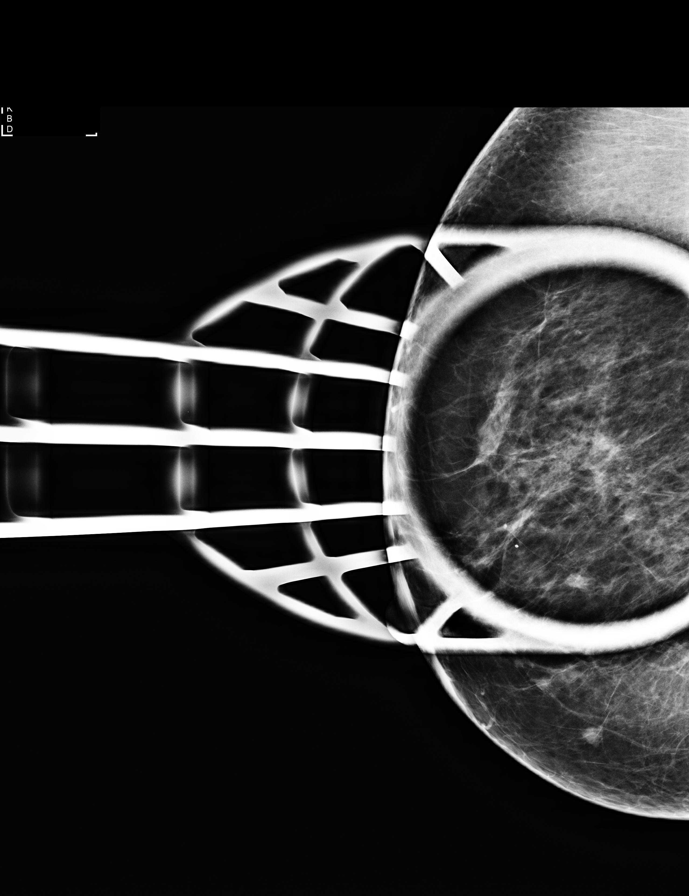

[R ML]
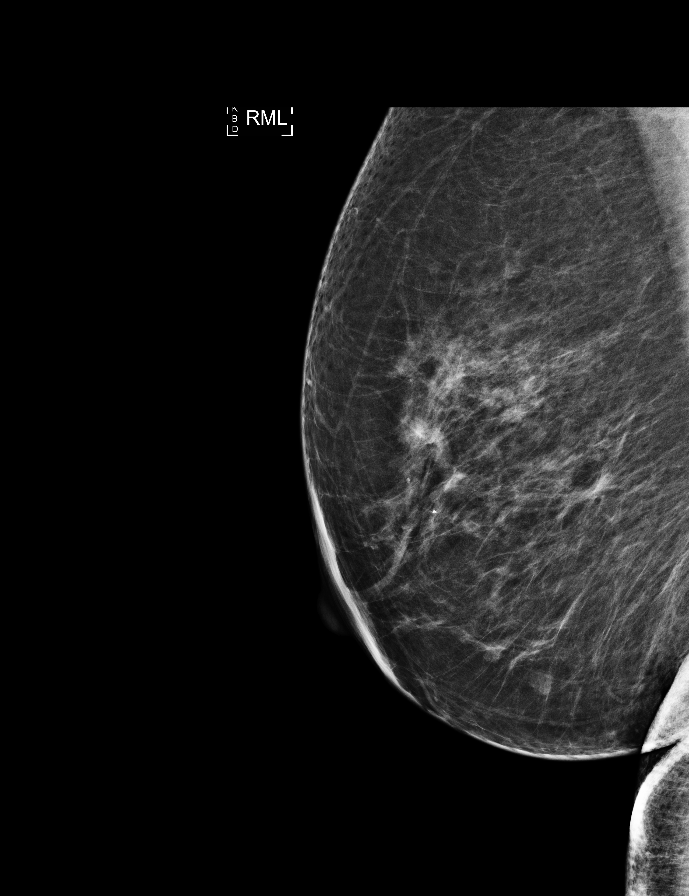

[L CC synth-2D]
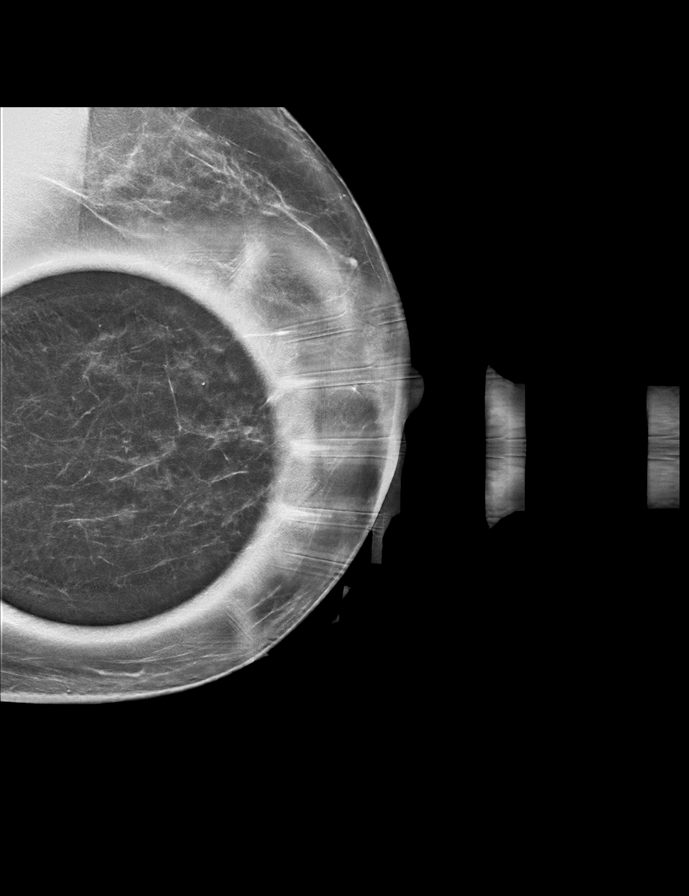

[L MLO synth-2D (2 of 2)]
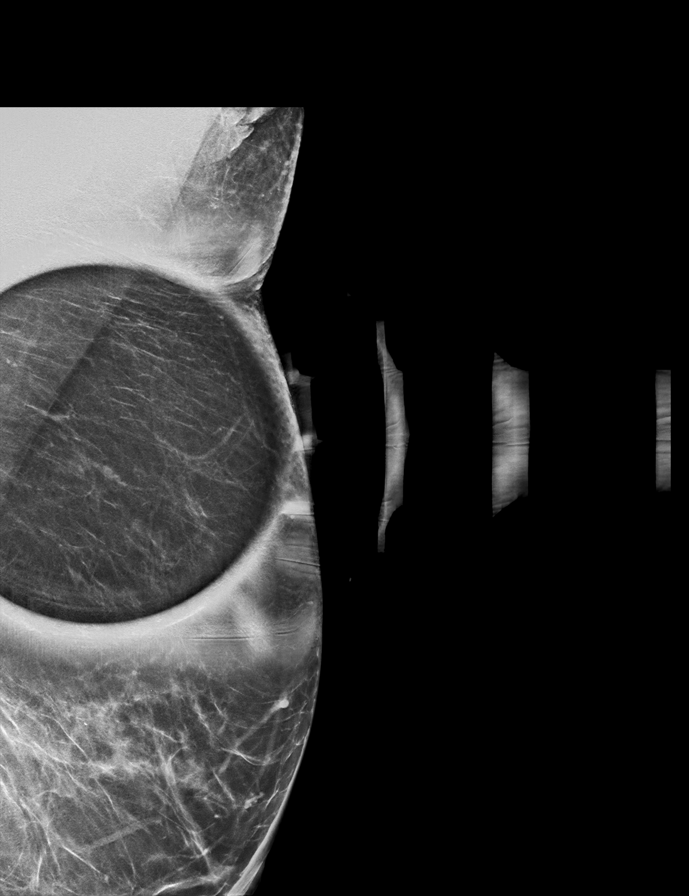

[L CC]
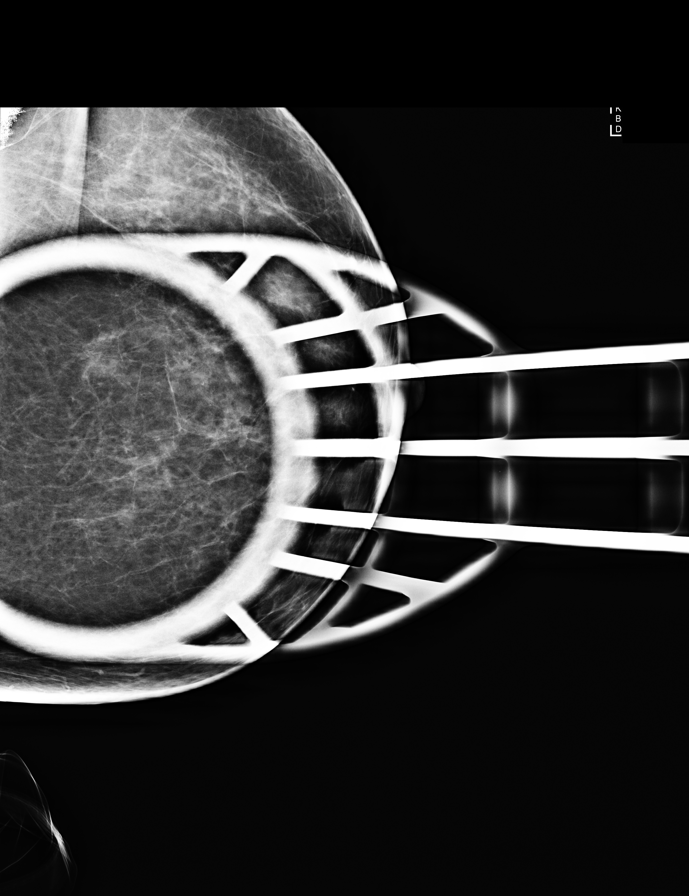

[8 of 38 positions shown; findings below may reference images not displayed]

No
prior ultrasound.

ACR Breast Density Category b: There are scattered areas of
fibroglandular density.
FINDINGS: Standard and tomosynthesis spot compression MLO view of the right
breast in the area of concern and a standard 2D and tomosynthesis
full field mediolateral view of the right breast were obtained. No
persistent mass or architectural distortion in the upper breast in
the area of concern on screening mammography. The area of concern
corresponds to focally dense fibroglandular tissue A benign
intramammary lymph node is present in the lower breast which is
stable dating back to at least 1330.

Standard and tomosynthesis spot-compression CC and MLO views of the
area of concern in the left breast were obtained. There is a
persistent focal asymmetry in the upper left breast at posterior
depth which has a central fat. There is no associated architectural
distortion or suspicious calcification.

The full field mediolateral image of the right breast was processed
with CAD.

On physical exam, there is slight palpable firmness in the upper
left breast at the site of the bruise on the skin in the area of
concern on mammography.

Targeted left breast ultrasound is performed, showing a hyperechoic
focus within a subcutaneous fat lobule at the 12 o'clock position
approximately 8 cm from the nipple measuring approximately 0.8 x
x 2.5 cm, without internal power Doppler flow, corresponding to the
mammographic finding. Within the hyperechoic focus is a
circumscribed oval nearly anechoic mass measuring up 3 mm. No
suspicious solid mass or abnormal acoustic shadowing is identified.
IMPRESSION: 1. Likely benign fat necrosis involving the upper left breast at the
12 o'clock position approximately 8 cm from the nipple which
accounts for the area of concern on screening mammography.
2. No mammographic evidence of malignancy, right breast. The area of
concern on screening mammography corresponds to focally dense
fibroglandular tissue.

RECOMMENDATION:
Diagnostic left mammogram with left breast ultrasound in 6 months.

I have discussed the findings and recommendations with the patient.
Results were also provided in writing at the conclusion of the
visit. If applicable, a reminder letter will be sent to the patient
regarding the next appointment.

BI-RADS CATEGORY  3: Probably benign.

## 2018-05-28 ENCOUNTER — Ambulatory Visit: Payer: Commercial Managed Care - PPO | Admitting: Adult Health

## 2018-05-28 ENCOUNTER — Other Ambulatory Visit: Payer: Self-pay

## 2018-05-28 ENCOUNTER — Encounter: Payer: Self-pay | Admitting: Adult Health

## 2018-05-28 VITALS — BP 136/88 | HR 90 | Temp 98.3°F | Resp 16 | Ht 62.0 in | Wt 174.0 lb

## 2018-05-28 DIAGNOSIS — H60501 Unspecified acute noninfective otitis externa, right ear: Secondary | ICD-10-CM | POA: Diagnosis not present

## 2018-05-28 DIAGNOSIS — J029 Acute pharyngitis, unspecified: Secondary | ICD-10-CM | POA: Diagnosis not present

## 2018-05-28 DIAGNOSIS — J0110 Acute frontal sinusitis, unspecified: Secondary | ICD-10-CM

## 2018-05-28 LAB — POCT INFLUENZA A/B
Influenza A, POC: NEGATIVE
Influenza B, POC: NEGATIVE

## 2018-05-28 MED ORDER — OFLOXACIN 0.3 % OT SOLN: 5.0000 [drp] | Freq: Every day | OTIC | 0 refills | Status: DC

## 2018-05-28 MED ORDER — SULFAMETHOXAZOLE-TRIMETHOPRIM 800-160 MG PO TABS: 1.0000 | ORAL_TABLET | Freq: Two times a day (BID) | ORAL | 0 refills | Status: DC

## 2018-05-28 NOTE — Progress Notes (Signed)
[facility] [place], [place] [address]  Internal MEDICINE  Office Visit Note  Patient Name: Brenda Guerrero  [id number]  [id number]  Date of Service: 07/17/2018  Chief Complaint  Patient presents with  . Sinusitis    started feeling bad friday , and really got worse on saturday   . Cough  . Ear Pain     HPI Pt is here for a sick visit. 4 days, cough, head congestion and fatigue.  PT reports last night she notoiced her right ear was hurting. She denies any fever,chills, or other symptoms.        Current Medication:  Outpatient Encounter Medications as of 05/28/2018  Medication Sig  . alendronate (FOSAMAX) 70 MG tablet TAKE 1 TABLET BY MOUTH ONCE WEEKLY FOR OSTEOPENIA  . aspirin 81 MG chewable tablet Chew 1 tablet (81 mg total) by mouth daily.  . calcium carbonate (OS-CAL) 1250 (500 Ca) MG chewable tablet Chew 1 tablet by mouth daily.  . cholecalciferol (VITAMIN D) 1000 units tablet Take 1,000 Units by mouth daily.  Marland Kitchen ibuprofen (ADVIL,MOTRIN) 800 MG tablet Take by mouth.  . Multiple Vitamin (MULTI-VITAMINS) TABS Take by mouth.  . nitroGLYCERIN (NITROSTAT) 0.4 MG SL tablet Place 1 tablet (0.4 mg total) under the tongue every 5 (five) minutes as needed for chest pain.  . Omega-3 Fatty Acids (FISH OIL) 1000 MG CPDR Take 2,000 mg by mouth daily.  . [DISCONTINUED] carvedilol (COREG) 3.125 MG tablet Take 1 tablet (3.125 mg total) by mouth 2 (two) times daily with a meal. Please schedule appointment for refills  . [DISCONTINUED] fluticasone (FLONASE) 50 MCG/ACT nasal spray Place 1 spray into both nostrils daily.  . [DISCONTINUED] rosuvastatin (CRESTOR) 10 MG tablet Take 1 tablet (10 mg total) by mouth daily.  Marland Kitchen ofloxacin (FLOXIN OTIC) 0.3 % OTIC solution Place 5 drops into the right ear daily.  . [DISCONTINUED] sulfamethoxazole-trimethoprim (BACTRIM DS,SEPTRA DS) 800-160 MG tablet Take 1 tablet by mouth 2 (two) times daily. (Patient not taking: Reported  on 07/16/2018)   No facility-administered encounter medications on file as of 05/28/2018.       Medical History: Past Medical History:  Diagnosis Date  . Asthma   . CAD in native artery   . Hyperlipidemia LDL goal <70   . Hypertension   . ST elevation (STEMI) myocardial infarction involving left anterior descending coronary artery ([place]) 05/28/15   with DES to LAD and return to cath lab for restenosis requiring PTCA     Vital Signs: BP 136/88   Pulse 90   Temp 98.3 F (36.8 C) (Oral)   Resp 16   Ht 5\' 2"  (1.575 m)   Wt 174 lb (78.9 kg)   SpO2 94%   BMI 31.83 kg/m    Review of Systems  Constitutional: Negative for chills, fatigue and unexpected weight change.  HENT: Negative for congestion, rhinorrhea, sneezing and sore throat.   Eyes: Negative for photophobia, pain and redness.  Respiratory: Negative for cough, chest tightness and shortness of breath.   Cardiovascular: Negative for chest pain and palpitations.  Gastrointestinal: Negative for abdominal pain, constipation, diarrhea, nausea and vomiting.  Endocrine: Negative.   Genitourinary: Negative for dysuria and frequency.  Musculoskeletal: Negative for arthralgias, back pain, joint swelling and neck pain.  Skin: Negative for rash.  Allergic/Immunologic: Negative.   Neurological: Negative for tremors and numbness.  Hematological: Negative for adenopathy. Does not bruise/bleed easily.  Psychiatric/Behavioral: Negative for behavioral problems and sleep disturbance. The patient is not  nervous/anxious.     Physical Exam Vitals signs and nursing note reviewed.  Constitutional:      General: She is not in acute distress.    Appearance: She is well-developed. She is not diaphoretic.  HENT:     Head: Normocephalic and atraumatic.     Right Ear: Decreased hearing noted. Swelling and tenderness present. Tympanic membrane is injected and erythematous.     Mouth/Throat:     Pharynx: No oropharyngeal exudate.  Eyes:      Pupils: Pupils are equal, round, and reactive to light.  Neck:     Musculoskeletal: Normal range of motion and neck supple.     Thyroid: No thyromegaly.     Vascular: No JVD.     Trachea: No tracheal deviation.  Cardiovascular:     Rate and Rhythm: Normal rate and regular rhythm.     Heart sounds: Normal heart sounds. No murmur. No friction rub. No gallop.   Pulmonary:     Effort: Pulmonary effort is normal. No respiratory distress.     Breath sounds: Normal breath sounds. No wheezing or rales.  Chest:     Chest wall: No tenderness.  Abdominal:     Palpations: Abdomen is soft.     Tenderness: There is no abdominal tenderness. There is no guarding.  Musculoskeletal: Normal range of motion.  Lymphadenopathy:     Cervical: No cervical adenopathy.  Skin:    General: Skin is warm and dry.  Neurological:     Mental Status: She is alert and oriented to person, place, and time.     Cranial Nerves: No cranial nerve deficit.  Psychiatric:        Behavior: Behavior normal.        Thought Content: Thought content normal.        Judgment: Judgment normal.    Assessment/Plan: 1. Acute otitis externa of right ear, unspecified type We will prescribe patient course of ofloxacin drops for the right ear.  Have instructed patient to use drops throughout the day that she feels better.  She is return to clinic if symptoms fail to improve. - ofloxacin (FLOXIN OTIC) 0.3 % OTIC solution; Place 5 drops into the right ear daily.  Dispense: 5 mL; Refill: 0  2. Acute non-recurrent frontal sinusitis Bactrim DS 800-160mg  PO BID x 10day prescribed for patient. Advised patient to take entire course of antibiotics as prescribed with food. Pt should return to clinic in 7-10 days if symptoms fail to improve or new symptoms develop.   3. Sore throat Flu test negative.  - POCT Influenza A/B  General Counseling: Karmin verbalizes understanding of the findings of todays visit and agrees with plan of treatment. I  have discussed any further diagnostic evaluation that may be needed or ordered today. We also reviewed her medications today. she has been encouraged to call the office with any questions or concerns that should arise related to todays visit.   Orders Placed This Encounter  Procedures  . POCT Influenza A/B    Meds ordered this encounter  Medications  . ofloxacin (FLOXIN OTIC) 0.3 % OTIC solution    Sig: Place 5 drops into the right ear daily.    Dispense:  5 mL    Refill:  0  . DISCONTD: sulfamethoxazole-trimethoprim (BACTRIM DS,SEPTRA DS) 800-160 MG tablet    Sig: Take 1 tablet by mouth 2 (two) times daily.    Dispense:  14 tablet    Refill:  0    Time spent: 25  Minutes  This patient was seen by Orson Gear AGNP-C in Collaboration with Dr Lavera Guise as a part of collaborative care agreement.  Kendell Bane AGNP-C Internal Medicine

## 2018-05-30 ENCOUNTER — Telehealth: Payer: Self-pay

## 2018-05-30 ENCOUNTER — Other Ambulatory Visit: Payer: Self-pay | Admitting: Adult Health

## 2018-05-30 MED ORDER — AMOXICILLIN-POT CLAVULANATE 875-125 MG PO TABS: 1.0000 | ORAL_TABLET | Freq: Two times a day (BID) | ORAL | 0 refills | Status: DC

## 2018-05-30 NOTE — Progress Notes (Signed)
Pt having reaction to Bactrim.  Sent Amoxicillin. instructed patient to stop bactrim.

## 2018-05-30 NOTE — Telephone Encounter (Signed)
lmom we send amoxicillin and stopped bactrim

## 2018-05-31 NOTE — Telephone Encounter (Signed)
LMOM THAT STOPPED BACTRIM AND START AMOXICILLIN

## 2018-06-14 DIAGNOSIS — L570 Actinic keratosis: Secondary | ICD-10-CM | POA: Diagnosis not present

## 2018-06-14 DIAGNOSIS — L821 Other seborrheic keratosis: Secondary | ICD-10-CM | POA: Diagnosis not present

## 2018-07-03 ENCOUNTER — Telehealth: Payer: Self-pay

## 2018-07-03 MED ORDER — CARVEDILOL 3.125 MG PO TABS: 3.1250 mg | ORAL_TABLET | Freq: Two times a day (BID) | ORAL | 3 refills | Status: DC

## 2018-07-03 NOTE — Telephone Encounter (Signed)
Call to patient to reschedule in regards to COVID-19 restrictions.  ?  Pt is comfortable with rescheduling at this time and denies any new, urgent or worsening sx.    COVID-19 Pre-Screening:  1. Have you been in contact with someone who was sick?  no 2. Do you have any of the following symptoms (cough, fever, muscle pain, vomiting, diarrhea, weakness abdominal pain, rash, red eye, bruising or bleeding, joint pain, severe headache)?  no 3. Have you travelled internationally or out of state in the last month?  no  4. Do you need any refills at this time?  done  Patient aware of the following: Please be advised that we require, no one but yourself to come to appointment. If necessary, only one visitor may come with you into the building. They will also be asked the same screening questions. You will be contacted at a later time to reschedule. However, this will depend on ongoing evaluation of the Covid-19 situation.  Please call us if any new questions or concerns arise. We are here for advice.  Routing to COVID cancel pool.

## 2018-07-04 ENCOUNTER — Ambulatory Visit: Payer: Self-pay | Admitting: Nurse Practitioner

## 2018-07-04 ENCOUNTER — Ambulatory Visit: Payer: Commercial Managed Care - PPO | Admitting: Cardiovascular Disease

## 2018-07-05 ENCOUNTER — Encounter: Payer: Self-pay | Admitting: Nurse Practitioner

## 2018-07-05 ENCOUNTER — Ambulatory Visit: Payer: Commercial Managed Care - PPO | Admitting: Nurse Practitioner

## 2018-07-05 VITALS — BP 141/79 | HR 70 | Resp 16 | Ht 62.0 in | Wt 173.4 lb

## 2018-07-05 DIAGNOSIS — I10 Essential (primary) hypertension: Secondary | ICD-10-CM

## 2018-07-05 DIAGNOSIS — I2510 Atherosclerotic heart disease of native coronary artery without angina pectoris: Secondary | ICD-10-CM | POA: Diagnosis not present

## 2018-07-05 DIAGNOSIS — J300 Vasomotor rhinitis: Secondary | ICD-10-CM

## 2018-07-05 MED ORDER — FLUTICASONE PROPIONATE 50 MCG/ACT NA SUSP: 1.0000 | Freq: Every day | NASAL | 5 refills | Status: DC

## 2018-07-05 NOTE — Progress Notes (Signed)
[facility] [place], [place] [address]  Internal MEDICINE  Office Visit Note  Patient Name: Brenda Guerrero  [id number]  [id number]  Date of Service: 07/18/2018  Chief Complaint  Patient presents with  . Follow-up    Hypertension  This is a chronic problem. The current episode started more than 1 year ago. The problem is unchanged. The problem is controlled. Associated symptoms include headaches. Pertinent negatives include no chest pain, neck pain, palpitations or shortness of breath. There are no associated agents to hypertension. Risk factors for coronary artery disease include dyslipidemia and post-menopausal state. Past treatments include beta blockers. The current treatment provides moderate improvement. There are no compliance problems.  Hypertensive end-organ damage includes CAD/MI.       Current Medication: Outpatient Encounter Medications as of 07/05/2018  Medication Sig  . alendronate (FOSAMAX) 70 MG tablet TAKE 1 TABLET BY MOUTH ONCE WEEKLY FOR OSTEOPENIA  . aspirin 81 MG chewable tablet Chew 1 tablet (81 mg total) by mouth daily.  . calcium carbonate (OS-CAL) 1250 (500 Ca) MG chewable tablet Chew 1 tablet by mouth daily.  . carvedilol (COREG) 3.125 MG tablet Take 1 tablet (3.125 mg total) by mouth 2 (two) times daily with a meal. Please schedule appointment for refills  . cholecalciferol (VITAMIN D) 1000 units tablet Take 1,000 Units by mouth daily.  . fluticasone (FLONASE) 50 MCG/ACT nasal spray Place 1 spray into both nostrils daily.  Marland Kitchen ibuprofen (ADVIL,MOTRIN) 800 MG tablet Take by mouth.  . Multiple Vitamin (MULTI-VITAMINS) TABS Take by mouth.  . nitroGLYCERIN (NITROSTAT) 0.4 MG SL tablet Place 1 tablet (0.4 mg total) under the tongue every 5 (five) minutes as needed for chest pain.  Marland Kitchen ofloxacin (FLOXIN OTIC) 0.3 % OTIC solution Place 5 drops into the right ear daily.  . Omega-3 Fatty Acids (FISH OIL) 1000 MG CPDR Take 2,000 mg by  mouth daily.  . [DISCONTINUED] fluticasone (FLONASE) 50 MCG/ACT nasal spray Place 1 spray into both nostrils daily.  . [DISCONTINUED] rosuvastatin (CRESTOR) 10 MG tablet Take 1 tablet (10 mg total) by mouth daily.  . [DISCONTINUED] sulfamethoxazole-trimethoprim (BACTRIM DS,SEPTRA DS) 800-160 MG tablet Take 1 tablet by mouth 2 (two) times daily. (Patient not taking: Reported on 07/16/2018)  . [DISCONTINUED] amoxicillin-clavulanate (AUGMENTIN) 875-125 MG tablet Take 1 tablet by mouth 2 (two) times daily. (Patient not taking: Reported on 07/05/2018)   No facility-administered encounter medications on file as of 07/05/2018.     Surgical History: Past Surgical History:  Procedure Laterality Date  . CARDIAC CATHETERIZATION N/A 05/28/2015   Procedure: Left Heart Cath and Coronary Angiography;  Surgeon: Peter M Martinique, MD;  Location: [facility];  Service: Cardiovascular;  Laterality: N/A;  . CARDIAC CATHETERIZATION N/A 05/28/2015   Procedure: Coronary Stent Intervention;  Surgeon: Peter M Martinique, MD;  Location: [facility];  Service: Cardiovascular;  Laterality: N/A;  . CARDIAC CATHETERIZATION N/A 05/28/2015   Procedure: Coronary Stent Intervention;  Surgeon: Peter M Martinique, MD;  Location: [facility];  Service: Cardiovascular;  Laterality: N/A;  . CARDIAC CATHETERIZATION N/A 05/28/2015   Procedure: Coronary/Graft Angiography;  Surgeon: Peter M Martinique, MD;  Location: [facility];  Service: Cardiovascular;  Laterality: N/A;  . CARDIAC CATHETERIZATION N/A 05/28/2015   Procedure: Intravascular Ultrasound/IVUS;  Surgeon: Peter M Martinique, MD;  Location: [facility];  Service: Cardiovascular;  Laterality: N/A;  . TUBAL LIGATION      Medical History: Past Medical History:  Diagnosis Date  .  Asthma   . CAD in native artery   . Hyperlipidemia LDL goal <70   . Hypertension   . ST elevation (STEMI) myocardial infarction involving left anterior descending coronary artery ([place])  05/28/15   with DES to LAD and return to cath lab for restenosis requiring PTCA    Family History: Family History  Problem Relation Age of Onset  . Heart disease Father   . Heart attack Brother 21  . Breast cancer Neg Hx     Social History   Socioeconomic History  . Marital status: Married    Spouse name: Not on file  . Number of children: Not on file  . Years of education: Not on file  . Highest education level: Not on file  Occupational History  . Not on file  Social Needs  . Financial resource strain: Not on file  . Food insecurity:    Worry: Not on file    Inability: Not on file  . Transportation needs:    Medical: Not on file    Non-medical: Not on file  Tobacco Use  . Smoking status: Former Research scientist (life sciences)  . Smokeless tobacco: Never Used  Substance and Sexual Activity  . Alcohol use: No  . Drug use: No  . Sexual activity: Yes    Birth control/protection: Post-menopausal  Lifestyle  . Physical activity:    Days per week: Not on file    Minutes per session: Not on file  . Stress: Not on file  Relationships  . Social connections:    Talks on phone: Not on file    Gets together: Not on file    Attends religious service: Not on file    Active member of club or organization: Not on file    Attends meetings of clubs or organizations: Not on file    Relationship status: Not on file  . Intimate partner violence:    Fear of current or ex partner: Not on file    Emotionally abused: Not on file    Physically abused: Not on file    Forced sexual activity: Not on file  Other Topics Concern  . Not on file  Social History Narrative  . Not on file      Review of Systems  Constitutional: Negative for activity change, chills, fatigue and unexpected weight change.  HENT: Negative for congestion, postnasal drip, rhinorrhea, sneezing and sore throat.   Eyes: Negative for redness.  Respiratory: Negative for cough, chest tightness, shortness of breath and wheezing.    Cardiovascular: Negative for chest pain and palpitations.  Gastrointestinal: Negative for abdominal pain, constipation, diarrhea, nausea and vomiting.  Endocrine: Negative for cold intolerance, heat intolerance, polydipsia and polyuria.  Genitourinary: Negative for dysuria and frequency.  Musculoskeletal: Negative for arthralgias, back pain, joint swelling and neck pain.  Skin: Negative for rash.  Allergic/Immunologic: Negative for environmental allergies.  Neurological: Positive for headaches. Negative for tremors and numbness.  Hematological: Negative for adenopathy. Does not bruise/bleed easily.  Psychiatric/Behavioral: Negative for behavioral problems (Depression), sleep disturbance and suicidal ideas. The patient is not nervous/anxious.     Today's Vitals   07/05/18 1057  BP: (!) 141/79  Pulse: 70  Resp: 16  SpO2: 96%  Weight: 173 lb 6.4 oz (78.7 kg)  Height: 5\' 2"  (1.575 m)   Body mass index is 31.72 kg/m.  Physical Exam Vitals signs and nursing note reviewed.  Constitutional:      General: She is not in acute distress.    Appearance: She  is well-developed. She is not diaphoretic.  HENT:     Head: Normocephalic and atraumatic.     Mouth/Throat:     Pharynx: No oropharyngeal exudate.  Eyes:     Pupils: Pupils are equal, round, and reactive to light.  Neck:     Musculoskeletal: Normal range of motion and neck supple.     Thyroid: No thyromegaly.     Vascular: No carotid bruit or JVD.     Trachea: No tracheal deviation.  Cardiovascular:     Rate and Rhythm: Normal rate and regular rhythm.     Heart sounds: Normal heart sounds. No murmur. No friction rub. No gallop.   Pulmonary:     Effort: Pulmonary effort is normal. No respiratory distress.     Breath sounds: Normal breath sounds. No wheezing or rales.  Chest:     Chest wall: No tenderness.  Abdominal:     General: Bowel sounds are normal.     Palpations: Abdomen is soft.     Tenderness: There is no abdominal  tenderness.  Musculoskeletal: Normal range of motion.  Lymphadenopathy:     Cervical: No cervical adenopathy.  Skin:    General: Skin is warm and dry.  Neurological:     Mental Status: She is alert and oriented to person, place, and time.     Cranial Nerves: No cranial nerve deficit.  Psychiatric:        Behavior: Behavior normal.        Thought Content: Thought content normal.        Judgment: Judgment normal.   Assessment/Plan: 1. Essential hypertension Stable. Continue bp medication as prescribed.   2. Coronary artery disease involving native coronary artery of native heart without angina pectoris Regular visits with cardiology as scheduled.   3. Vasomotor rhinitis Add flonase nasal spray - use two sprays in both nostrils daily.  - fluticasone (FLONASE) 50 MCG/ACT nasal spray; Place 1 spray into both nostrils daily.  Dispense: 48 g; Refill: 5  General Counseling: Kolby verbalizes understanding of the findings of todays visit and agrees with plan of treatment. I have discussed any further diagnostic evaluation that may be needed or ordered today. We also reviewed her medications today. she has been encouraged to call the office with any questions or concerns that should arise related to todays visit.  Hypertension Counseling:   The following hypertensive lifestyle modification were recommended and discussed:  1. Limiting alcohol intake to less than 1 oz/day of ethanol:(24 oz of beer or 8 oz of wine or 2 oz of 100-proof whiskey). 2. Take baby ASA 81 mg daily. 3. Importance of regular aerobic exercise and losing weight. 4. Reduce dietary saturated fat and cholesterol intake for overall cardiovascular health. 5. Maintaining adequate dietary potassium, calcium, and magnesium intake. 6. Regular monitoring of the blood pressure. 7. Reduce sodium intake to less than 100 mmol/day (less than 2.3 gm of sodium or less than 6 gm of sodium choride)   This patient was seen by [place] with Dr Lavera Guise as a part of collaborative care agreement  Meds ordered this encounter  Medications  . fluticasone (FLONASE) 50 MCG/ACT nasal spray    Sig: Place 1 spray into both nostrils daily.    Dispense:  48 g    Refill:  5    Order Specific Question:   Supervising Provider    Answer:   Lavera Guise [[id number]]    Time spent: 25 Minutes  Dr Lavera Guise Internal medicine

## 2018-07-12 ENCOUNTER — Telehealth: Payer: Self-pay | Admitting: Nurse Practitioner

## 2018-07-12 ENCOUNTER — Other Ambulatory Visit: Payer: Self-pay | Admitting: Nurse Practitioner

## 2018-07-12 ENCOUNTER — Telehealth: Payer: Self-pay | Admitting: Cardiovascular Disease

## 2018-07-12 DIAGNOSIS — Z1231 Encounter for screening mammogram for malignant neoplasm of breast: Secondary | ICD-10-CM

## 2018-07-12 NOTE — Telephone Encounter (Signed)
She should be seen to document this on Tuesday or Wednesday.

## 2018-07-12 NOTE — Telephone Encounter (Signed)
Can you please schedule patient.

## 2018-07-12 NOTE — Telephone Encounter (Signed)
Patient called and states that she was here last week , but noticed last Thursday that she had some soreness to the right breast, failed to tell Nira Conn and she is worried because its still sore and she felt like a knot or nodule, she called to make her mammogram appointment and told them about her symptoms but [facility] is wanting an order for a diagnostic mammogram .

## 2018-07-12 NOTE — Telephone Encounter (Signed)
Virtual Visit Pre-Appointment Phone Call  Steps For Call:  1. Confirm consent - "In the setting of the current Covid19 crisis, you are scheduled for a (phone or video) visit with your provider on (date) at (time).  Just as we do with many in-office visits, in order for you to participate in this visit, we must obtain consent.  If you'd like, I can send this to your mychart (if signed up) or email for you to review.  Otherwise, I can obtain your verbal consent now.  All virtual visits are billed to your insurance company just like a normal visit would be.  By agreeing to a virtual visit, we'd like you to understand that the technology does not allow for your provider to perform an examination, and thus may limit your provider's ability to fully assess your condition.  Finally, though the technology is pretty good, we cannot assure that it will always work on either your or our end, and in the setting of a video visit, we may have to convert it to a phone-only visit.  In either situation, we cannot ensure that we have a secure connection.  Are you willing to proceed?"  2. Give patient instructions for WebEx download to smartphone as below if video visit  3. Advise patient to be prepared with any vital sign or heart rhythm information, their current medicines, and a piece of paper and pen handy for any instructions they may receive the day of their visit  4. Inform patient they will receive a phone call 15 minutes prior to their appointment time (may be from unknown caller ID) so they should be prepared to answer  5. Confirm that appointment type is correct in Epic appointment notes (video vs telephone)    TELEPHONE CALL NOTE  Brenda Guerrero has been deemed a candidate for a follow-up tele-health visit to limit community exposure during the Covid-19 pandemic. I spoke with the patient via phone to ensure availability of phone/video source, confirm preferred email & phone number, and  discuss instructions and expectations.  I reminded Tanishi Nault to be prepared with any vital sign and/or heart rhythm information that could potentially be obtained via home monitoring, at the time of her visit. I reminded Dannica Bickham to expect a phone call at the time of her visit if her visit.  Did the patient verbally acknowledge consent to treatment?  Forest Becker 07/12/2018 4:11 PM   DOWNLOADING THE [place]  - If Apple, go to [employer] and type in WebEx in the search bar. [place] [employer], the blue/green circle. The app is free but as with any other app downloads, their phone may require them to verify saved payment information or Apple password. The patient does NOT have to create an account.  - If Android, ask patient to go to [employer] and type in WebEx in the search bar. [place] [employer], the blue/green circle. The app is free but as with any other app downloads, their phone may require them to verify saved payment information or Android password. The patient does NOT have to create an account.   CONSENT FOR TELE-HEALTH VISIT - PLEASE REVIEW  I hereby voluntarily request, consent and authorize [facility] and its employed or contracted physicians, physician assistants, nurse practitioners or other licensed health care professionals (the Practitioner), to provide me with telemedicine health care services (the "Services") as deemed necessary by the treating Practitioner. I  acknowledge and consent to receive the Services by the Practitioner via telemedicine. I understand that the telemedicine visit will involve communicating with the Practitioner through live audiovisual communication technology and the disclosure of certain medical information by electronic transmission. I acknowledge that I have been given the opportunity to request an in-person assessment or other available alternative prior to the  telemedicine visit and am voluntarily participating in the telemedicine visit.  I understand that I have the right to withhold or withdraw my consent to the use of telemedicine in the course of my care at any time, without affecting my right to future care or treatment, and that the Practitioner or I may terminate the telemedicine visit at any time. I understand that I have the right to inspect all information obtained and/or recorded in the course of the telemedicine visit and may receive copies of available information for a reasonable fee.  I understand that some of the potential risks of receiving the Services via telemedicine include:  Marland Kitchen Delay or interruption in medical evaluation due to technological equipment failure or disruption; . Information transmitted may not be sufficient (e.g. poor resolution of images) to allow for appropriate medical decision making by the Practitioner; and/or  . In rare instances, security protocols could fail, causing a breach of personal health information.  Furthermore, I acknowledge that it is my responsibility to provide information about my medical history, conditions and care that is complete and accurate to the best of my ability. I acknowledge that Practitioner's advice, recommendations, and/or decision may be based on factors not within their control, such as incomplete or inaccurate data provided by me or distortions of diagnostic images or specimens that may result from electronic transmissions. I understand that the practice of medicine is not an exact science and that Practitioner makes no warranties or guarantees regarding treatment outcomes. I acknowledge that I will receive a copy of this consent concurrently upon execution via email to the email address I last provided but may also request a printed copy by calling the office of [facility].    I understand that my insurance will be billed for this visit.   I have read or had this consent read to  me. . I understand the contents of this consent, which adequately explains the benefits and risks of the Services being provided via telemedicine.  . I have been provided ample opportunity to ask questions regarding this consent and the Services and have had my questions answered to my satisfaction. . I give my informed consent for the services to be provided through the use of telemedicine in my medical care  By participating in this telemedicine visit I agree to the above.

## 2018-07-12 NOTE — Telephone Encounter (Signed)
LMOV to schedule fu Evisit

## 2018-07-12 NOTE — Telephone Encounter (Signed)
PT APPT WAS SCHEDULED ON Tuesday 07/16/2018 AT 9:00 A.M.

## 2018-07-16 ENCOUNTER — Other Ambulatory Visit: Payer: Self-pay | Admitting: Nurse Practitioner

## 2018-07-16 ENCOUNTER — Ambulatory Visit: Payer: Commercial Managed Care - PPO | Admitting: Nurse Practitioner

## 2018-07-16 ENCOUNTER — Telehealth (INDEPENDENT_AMBULATORY_CARE_PROVIDER_SITE_OTHER): Payer: Commercial Managed Care - PPO | Admitting: Cardiovascular Disease

## 2018-07-16 ENCOUNTER — Encounter: Payer: Self-pay | Admitting: Cardiovascular Disease

## 2018-07-16 ENCOUNTER — Encounter: Payer: Self-pay | Admitting: Nurse Practitioner

## 2018-07-16 ENCOUNTER — Other Ambulatory Visit: Payer: Self-pay

## 2018-07-16 VITALS — BP 128/88 | Ht 62.5 in | Wt 172.3 lb

## 2018-07-16 VITALS — BP 128/88 | HR 73 | Resp 16 | Ht 62.0 in | Wt 175.4 lb

## 2018-07-16 DIAGNOSIS — I10 Essential (primary) hypertension: Secondary | ICD-10-CM | POA: Diagnosis not present

## 2018-07-16 DIAGNOSIS — N6310 Unspecified lump in the right breast, unspecified quadrant: Secondary | ICD-10-CM

## 2018-07-16 DIAGNOSIS — I25118 Atherosclerotic heart disease of native coronary artery with other forms of angina pectoris: Secondary | ICD-10-CM | POA: Diagnosis not present

## 2018-07-16 DIAGNOSIS — D4861 Neoplasm of uncertain behavior of right breast: Secondary | ICD-10-CM | POA: Diagnosis not present

## 2018-07-16 DIAGNOSIS — I2510 Atherosclerotic heart disease of native coronary artery without angina pectoris: Secondary | ICD-10-CM | POA: Insufficient documentation

## 2018-07-16 DIAGNOSIS — R7303 Prediabetes: Secondary | ICD-10-CM | POA: Diagnosis not present

## 2018-07-16 DIAGNOSIS — E785 Hyperlipidemia, unspecified: Secondary | ICD-10-CM

## 2018-07-16 MED ORDER — ROSUVASTATIN CALCIUM 40 MG PO TABS: 40.0000 mg | ORAL_TABLET | Freq: Every day | ORAL | 3 refills | Status: DC

## 2018-07-16 NOTE — Progress Notes (Signed)
Pt blood pressure elevated, taken twice 1st reading 140/97 2nd reading 149/92 Informed provider

## 2018-07-16 NOTE — Progress Notes (Signed)
[facility] [place], [place] [address]  Internal MEDICINE  Office Visit Note  Patient Name: Brenda Guerrero  [id number]  [id number]  Date of Service: 07/16/2018  Chief Complaint  Patient presents with  . Pain    knot on right breast and then she felt it on the left side. soreness comes and goes,      The patient has had soreness and palpable nodule in right breast which was first noted several days ago. She did go for her screening mammogram, and was told she needed to be seen per PCP to get diagnostic imaging done for right breast. Tenderness comes and goes. Since she noted it, she has cut back on caffeine use. She did self breast exam and noted a nodule which is tender.   Pt is here for a sick visit.     Current Medication:  Outpatient Encounter Medications as of 07/16/2018  Medication Sig  . alendronate (FOSAMAX) 70 MG tablet TAKE 1 TABLET BY MOUTH ONCE WEEKLY FOR OSTEOPENIA  . aspirin 81 MG chewable tablet Chew 1 tablet (81 mg total) by mouth daily.  . calcium carbonate (OS-CAL) 1250 (500 Ca) MG chewable tablet Chew 1 tablet by mouth daily.  . carvedilol (COREG) 3.125 MG tablet Take 1 tablet (3.125 mg total) by mouth 2 (two) times daily with a meal. Please schedule appointment for refills  . cholecalciferol (VITAMIN D) 1000 units tablet Take 1,000 Units by mouth daily.  . fluticasone (FLONASE) 50 MCG/ACT nasal spray Place 1 spray into both nostrils daily.  Marland Kitchen ibuprofen (ADVIL,MOTRIN) 800 MG tablet Take by mouth.  . Multiple Vitamin (MULTI-VITAMINS) TABS Take by mouth.  . nitroGLYCERIN (NITROSTAT) 0.4 MG SL tablet Place 1 tablet (0.4 mg total) under the tongue every 5 (five) minutes as needed for chest pain.  Marland Kitchen ofloxacin (FLOXIN OTIC) 0.3 % OTIC solution Place 5 drops into the right ear daily.  . Omega-3 Fatty Acids (FISH OIL) 1000 MG CPDR Take 2,000 mg by mouth daily.  . rosuvastatin (CRESTOR) 10 MG tablet Take 1 tablet (10 mg total) by mouth  daily.  . [DISCONTINUED] amoxicillin-clavulanate (AUGMENTIN) 875-125 MG tablet Take 1 tablet by mouth 2 (two) times daily. (Patient not taking: Reported on 07/05/2018)  . [DISCONTINUED] sulfamethoxazole-trimethoprim (BACTRIM DS,SEPTRA DS) 800-160 MG tablet Take 1 tablet by mouth 2 (two) times daily. (Patient not taking: Reported on 07/16/2018)   No facility-administered encounter medications on file as of 07/16/2018.       Medical History: Past Medical History:  Diagnosis Date  . Asthma   . CAD in native artery   . Hyperlipidemia LDL goal <70   . Hypertension   . ST elevation (STEMI) myocardial infarction involving left anterior descending coronary artery ([place]) 05/28/15   with DES to LAD and return to cath lab for restenosis requiring PTCA     Today's Vitals   07/16/18 0906  BP: 128/88  Pulse: 73  Resp: 16  SpO2: 98%  Weight: 175 lb 6.4 oz (79.6 kg)  Height: 5\' 2"  (1.575 m)   Body mass index is 32.08 kg/m.  Review of Systems  Constitutional: Negative for chills, fatigue and unexpected weight change.  HENT: Negative for congestion, postnasal drip, rhinorrhea, sneezing and sore throat.   Eyes: Negative for redness.  Respiratory: Negative for cough, chest tightness and shortness of breath.   Cardiovascular: Negative for chest pain and palpitations.  Genitourinary:       Bilateral breast tenderness. Small nodule noted deep in right  breast tissue.   Skin: Negative for rash.  Hematological: Negative for adenopathy. Does not bruise/bleed easily.  Psychiatric/Behavioral: Negative for behavioral problems (Depression), sleep disturbance and suicidal ideas. The patient is nervous/anxious.     Physical Exam Vitals signs and nursing note reviewed.  Constitutional:      General: She is not in acute distress.    Appearance: Normal appearance. She is well-developed. She is not diaphoretic.  HENT:     Head: Normocephalic and atraumatic.     Mouth/Throat:     Pharynx: No oropharyngeal  exudate.  Eyes:     Pupils: Pupils are equal, round, and reactive to light.  Neck:     Musculoskeletal: Normal range of motion and neck supple.     Thyroid: No thyromegaly.     Vascular: No JVD.     Trachea: No tracheal deviation.  Cardiovascular:     Rate and Rhythm: Normal rate and regular rhythm.     Heart sounds: Normal heart sounds. No murmur. No friction rub. No gallop.   Pulmonary:     Effort: Pulmonary effort is normal. No respiratory distress.     Breath sounds: Normal breath sounds. No wheezing or rales.  Chest:     Chest wall: No tenderness.    Abdominal:     General: Bowel sounds are normal.     Palpations: Abdomen is soft.  Musculoskeletal: Normal range of motion.  Lymphadenopathy:     Cervical: No cervical adenopathy.  Neurological:     Mental Status: She is alert and oriented to person, place, and time.     Cranial Nerves: No cranial nerve deficit.  Psychiatric:        Behavior: Behavior normal.        Thought Content: Thought content normal.        Judgment: Judgment normal.    Assessment/Plan:  1. 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 Should have screening mammogram for left breast.  - MM Digital Diagnostic Unilat R; Future  2. Essential hypertension Stable. Continue blood pressure medication as prescribed.   General Counseling: damiah mcdonald understanding of the findings of todays visit and agrees with plan of treatment. I have discussed any further diagnostic evaluation that may be needed or ordered today. We also reviewed her medications today. she has been encouraged to call the office with any questions or concerns that should arise related to todays visit.    Counseling:  This patient was seen by Leretha Pol FNP Collaboration with Dr Lavera Guise as a part of collaborative care agreement  Orders Placed This Encounter  Procedures  . MM Digital  Diagnostic Unilat R    Time spent: 25 Minutes

## 2018-07-16 NOTE — Patient Instructions (Addendum)
Medication Instructions:  Please increase crestor up to 40 mg daily (Cut the pill in half for several weeks before going to 40)  If you need a refill on your cardiac medications before your next appointment, please call your pharmacy.    Lab work: No new labs needed   If you have labs (blood work) drawn today and your tests are completely normal, you will receive your results only by: Marland Kitchen MyChart Message (if you have MyChart) OR . A paper copy in the mail If you have any lab test that is abnormal or we need to change your treatment, we will call you to review the results.   Testing/Procedures: No new testing needed   Follow-Up: At [facility], you and your health needs are our priority.  As part of our continuing mission to provide you with exceptional heart care, we have created designated Provider Care Teams.  These Care Teams include your primary Cardiologist (physician) and Advanced Practice Providers (APPs -  Physician Assistants and Nurse Practitioners) who all work together to provide you with the care you need, when you need it.  . You will need a follow up appointment in 6 months .   Please call our office 2 months in advance to schedule this appointment.    . Providers on your designated Care Team:   . Murray Hodgkins, NP . Christell Faith, PA-C . Marrianne Mood, PA-C  Any Other Special Instructions Will Be Listed Below (If Applicable).  For educational health videos Log in to : [email or web address] Or : [email or web address], password : [email or web address]

## 2018-07-16 NOTE — Progress Notes (Addendum)
Virtual Visit via Video Note   This visit type was conducted due to national recommendations for restrictions regarding the COVID-19 Pandemic (e.g. social distancing) in an effort to limit this patient's exposure and mitigate transmission in our community.  Due to her co-morbid illnesses, this patient is at least at moderate risk for complications without adequate follow up.  This format is felt to be most appropriate for this patient at this time.  All issues noted in this document were discussed and addressed.  A limited physical exam was performed with this format.  Please refer to the patient's chart for her consent to telehealth for [facility].    Date:  07/16/2018   ID:  [name], DOB 1955-08-26, MRN [id number]  Patient Location:  [address] [place] [address]   Provider location:   [facility], [place] office  PCP:  Ronnell Freshwater, NP  Cardiologist:  No primary care provider on file.   Chief Complaint: Weight gain,CAD    History of Present Illness:    Brenda Guerrero is a 63 y.o. female who presents via audio/video conferencing for a telehealth visit today.   The patient does not symptoms concerning for COVID-19 infection (fever, chills, cough, or new SHORTNESS OF BREATH).  Please refer to prior office visit for complete details: Patient has a past medical history of CAD.  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    Who presents for follow-up of her coronary disease, prior STEMI  Prior history reviewed with her on today's visit, new patient with me presented to [facility] on 05/28/15 as an anterior STEMI.   cath  single vessel obstructive CAD, DES to LAD.   Pk troponin 4.  30 minutes of arrival in the ICU the patient had marked increase in chest pain and hyperacute ST elevation consistent with reocclusion.  taken back to the cath lab and found to have reocclusion of the vessel.  She underwent  repeat stenting of the LAD and PTCA for acute stent thrombosis.  EF normal by Echo   frustrated with her elevated  weight.  She has changed her diet. Last fasting blood sugar elevated  Fit bit is walking 8-10K steps a day  brother has an LVAD and is listed for transplant for ischemic CM.    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 Wall thickness was   normal. Systolic function was normal. The estimated ejection   fraction was in the range of 55% to 60%. Wall motion was normal;   there were no regional wall motion abnormalities. Doppler   parameters are consistent with abnormal left ventricular   relaxation (grade 1 diastolic dysfunction).  Impressions:  - Normal LV systolic function; grade 1 diastolic dysfunction; trace   MR and TR.   Past Medical History:  Diagnosis Date  . Asthma   . CAD in native artery   . Hyperlipidemia LDL goal <70   . Hypertension   . ST elevation (STEMI) myocardial infarction involving left anterior descending coronary artery ([place]) 05/28/15   with DES to LAD and return to cath lab for restenosis  requiring PTCA   Past Surgical History:  Procedure Laterality Date  . CARDIAC CATHETERIZATION N/A 05/28/2015   Procedure: Left Heart Cath and Coronary Angiography;  Surgeon: Peter M Martinique, MD;  Location: [place] [facility];  Service: Cardiovascular;  Laterality: N/A;  . CARDIAC CATHETERIZATION N/A 05/28/2015   Procedure: Coronary Stent Intervention;  Surgeon: Peter M Martinique, MD;  Location: [place] [facility];  Service: Cardiovascular;  Laterality: N/A;  . CARDIAC CATHETERIZATION N/A 05/28/2015   Procedure: Coronary Stent Intervention;  Surgeon: Peter M  Martinique, MD;  Location: [place] [facility];  Service: Cardiovascular;  Laterality: N/A;  . CARDIAC CATHETERIZATION N/A 05/28/2015   Procedure: Coronary/Graft Angiography;  Surgeon: Peter M Martinique, MD;  Location: [place] [facility];  Service: Cardiovascular;  Laterality: N/A;  . CARDIAC CATHETERIZATION N/A 05/28/2015   Procedure: Intravascular Ultrasound/IVUS;  Surgeon: Peter M Martinique, MD;  Location: [place] [facility];  Service: Cardiovascular;  Laterality: N/A;  . TUBAL LIGATION       Current Meds  Medication Sig  . alendronate (FOSAMAX) 70 MG tablet TAKE 1 TABLET BY MOUTH ONCE WEEKLY FOR OSTEOPENIA  . aspirin 81 MG chewable tablet Chew 1 tablet (81 mg total) by mouth daily.  . calcium carbonate (OS-CAL) 1250 (500 Ca) MG chewable tablet Chew 1 tablet by mouth daily.  . carvedilol (COREG) 3.125 MG tablet Take 1 tablet (3.125 mg total) by mouth 2 (two) times daily with a meal. Please schedule appointment for refills  . cholecalciferol (VITAMIN D) 1000 units tablet Take 1,000 Units by mouth daily.  . fluticasone (FLONASE) 50 MCG/ACT nasal spray Place 1 spray into both nostrils daily.  Marland Kitchen ibuprofen (ADVIL,MOTRIN) 800 MG tablet Take by mouth.  . Multiple Vitamin (MULTI-VITAMINS) TABS Take by mouth.  . nitroGLYCERIN (NITROSTAT) 0.4 MG SL tablet Place 1 tablet (0.4 mg total) under the tongue every 5 (five) minutes as needed for chest pain.  Marland Kitchen ofloxacin (FLOXIN OTIC) 0.3 % OTIC solution Place 5 drops into the right ear daily.  . Omega-3 Fatty Acids (FISH OIL) 1000 MG CPDR Take 2,000 mg by mouth daily.  . rosuvastatin (CRESTOR) 10 MG tablet Take 1 tablet (10 mg total) by mouth daily.     Allergies:   Azithromycin   Social History   Tobacco Use  . Smoking status: Former Research scientist (life sciences)  . Smokeless tobacco: Never Used  Substance Use Topics  . Alcohol use: No  . Drug use: No     Current Outpatient Medications on File Prior to Visit  Medication Sig Dispense Refill  . alendronate (FOSAMAX) 70 MG tablet  TAKE 1 TABLET BY MOUTH ONCE WEEKLY FOR OSTEOPENIA 12 tablet 4  . aspirin 81 MG chewable tablet Chew 1 tablet (81 mg total) by mouth daily.    . calcium carbonate (OS-CAL) 1250 (500 Ca) MG chewable tablet Chew 1 tablet by mouth daily.    . carvedilol (COREG) 3.125 MG tablet Take 1 tablet (3.125 mg total) by mouth 2 (two) times daily with a meal. Please schedule appointment for refills 30 tablet 3  . cholecalciferol (VITAMIN D) 1000 units tablet Take 1,000 Units by mouth daily.    . fluticasone (FLONASE) 50 MCG/ACT nasal spray Place 1 spray into both nostrils daily. 48 g 5  . ibuprofen (ADVIL,MOTRIN) 800 MG tablet Take by mouth.    . Multiple Vitamin (MULTI-VITAMINS) TABS Take by mouth.    . nitroGLYCERIN (NITROSTAT) 0.4 MG SL tablet Place 1 tablet (0.4 mg total) under the tongue every 5 (five)  minutes as needed for chest pain. 25 tablet 2  . ofloxacin (FLOXIN OTIC) 0.3 % OTIC solution Place 5 drops into the right ear daily. 5 mL 0  . Omega-3 Fatty Acids (FISH OIL) 1000 MG CPDR Take 2,000 mg by mouth daily.    . rosuvastatin (CRESTOR) 10 MG tablet Take 1 tablet (10 mg total) by mouth daily. 90 tablet 4   No current facility-administered medications on file prior to visit.      Family Hx: The patient's family history includes Heart attack (age of onset: 32) in her brother; Heart disease in her father. There is no history of Breast cancer.  ROS:   Please see the history of present illness.    Review of Systems  Constitutional: Negative.   Respiratory: Negative.   Cardiovascular: Negative.   Gastrointestinal: Negative.   Musculoskeletal: Negative.   Neurological: Negative.   Psychiatric/Behavioral: Negative.   All other systems reviewed and are negative.     Labs/Other Tests and Data Reviewed:    Recent Labs: 12/28/2017: ALT 29; BUN 20; Creatinine, Ser 0.67; Hemoglobin 13.8; Platelets 235; Potassium 4.0; Sodium 139; TSH 1.923   Recent Lipid Panel Lab Results  Component Value  Date/Time   CHOL 184 12/28/2017 07:59 AM   CHOL 145 03/15/2012 10:36 AM   TRIG 197 (H) 12/28/2017 07:59 AM   TRIG 209 (H) 03/15/2012 10:36 AM   HDL 30 (L) 12/28/2017 07:59 AM   HDL 23 (L) 03/15/2012 10:36 AM   CHOLHDL 6.1 12/28/2017 07:59 AM   LDLCALC 115 (H) 12/28/2017 07:59 AM   LDLCALC 80 03/15/2012 10:36 AM    Wt Readings from Last 3 Encounters:  07/16/18 172 lb 5 oz (78.2 kg)  07/16/18 175 lb 6.4 oz (79.6 kg)  07/05/18 173 lb 6.4 oz (78.7 kg)     Exam:    Vital Signs:  BP 128/88   Ht 5' 2.5" (1.588 m)   Wt 172 lb 5 oz (78.2 kg)   BMI 31.01 kg/m    Well nourished, well developed female in no acute distress.   ASSESSMENT & PLAN:    Atherosclerosis of native coronary artery of native heart with stable angina pectoris (HCC) Currently with no symptoms of angina. No further workup at this time. Continue current medication regimen.  Dyslipidemia Cholesterol above goal, recommend she increase up to Crestor 20 mg daily (she will break a 40 mg pill in half) If tolerated after several weeks will go up to Crestor 40 If still not at goal can add Zetia to achieve goal LDL 60 or less  Essential hypertension Blood pressure is well controlled on today's visit. No changes made to the medications.  Prediabetes Numbers discussed with her Elevated glucose levels dating back several years Recommended certain dietary changes, low carbohydrates  Hypertriglycerides Recommended low breads, dietary changes low Pasta potatoes etc.   COVID-19 Education: The signs and symptoms of COVID-19 were discussed with the patient and how to seek care for testing (follow up with PCP or arrange E-visit).  The importance of social distancing was discussed today.  Patient Risk:   After full review of this patients clinical status, I feel that they are at least moderate risk at this time.  Time:   Today, I have spent 25 minutes with the patient with telehealth technology discussing prior STEMI,  management of lipids, triglycerides, weight loss strategies.     Medication Adjustments/Labs and Tests Ordered: Current medicines are reviewed at length with the patient today.  Concerns regarding medicines  are outlined above.   Tests Ordered: No tests ordered   Medication Changes: Increase Crestor to 40 mg daily   Disposition: Follow-up in 6 months   Signed, Ida Rogue, MD  07/16/2018 2:53 PM    [place] Office [address] [place] [address], [place], [place] [address]

## 2018-07-17 ENCOUNTER — Encounter: Payer: Self-pay | Admitting: Adult Health

## 2018-07-18 NOTE — Telephone Encounter (Signed)
Patient had E-visit on 07/16/2018. Will remove from COVID-19 pool.

## 2018-07-19 ENCOUNTER — Telehealth: Payer: Self-pay | Admitting: Nurse Practitioner

## 2018-07-19 NOTE — Telephone Encounter (Signed)
Yes you put it for just the one breast it needs to state bilateral I guess

## 2018-07-19 NOTE — Telephone Encounter (Signed)
Needing the order for mammogram for diagnostic mammogram bilateral for [facility] , not just the right one

## 2018-07-19 NOTE — Telephone Encounter (Signed)
I did order this at her last visit. Do I need to do something different?

## 2018-07-23 ENCOUNTER — Ambulatory Visit
Admission: RE | Admit: 2018-07-23 | Discharge: 2018-07-23 | Disposition: A | Discharge status: Home / Self Care | Payer: Commercial Managed Care - PPO | Source: Ambulatory Visit | Attending: Nurse Practitioner | Admitting: Nurse Practitioner

## 2018-07-23 ENCOUNTER — Other Ambulatory Visit: Payer: Self-pay

## 2018-07-23 ENCOUNTER — Other Ambulatory Visit: Payer: Self-pay | Admitting: Nurse Practitioner

## 2018-07-23 DIAGNOSIS — N6310 Unspecified lump in the right breast, unspecified quadrant: Secondary | ICD-10-CM

## 2018-07-23 DIAGNOSIS — N6311 Unspecified lump in the right breast, upper outer quadrant: Secondary | ICD-10-CM | POA: Diagnosis not present

## 2018-07-23 DIAGNOSIS — N6314 Unspecified lump in the right breast, lower inner quadrant: Secondary | ICD-10-CM | POA: Diagnosis not present

## 2018-07-23 DIAGNOSIS — N6001 Solitary cyst of right breast: Secondary | ICD-10-CM

## 2018-07-28 ENCOUNTER — Other Ambulatory Visit: Payer: Self-pay | Admitting: Cardiovascular Disease

## 2019-01-09 ENCOUNTER — Encounter: Payer: Self-pay | Admitting: Adult Health

## 2019-01-24 ENCOUNTER — Other Ambulatory Visit: Payer: Self-pay | Admitting: Cardiovascular Disease

## 2019-01-27 ENCOUNTER — Other Ambulatory Visit: Payer: Self-pay

## 2019-01-27 DIAGNOSIS — M810 Age-related osteoporosis without current pathological fracture: Secondary | ICD-10-CM

## 2019-01-27 MED ORDER — ALENDRONATE SODIUM 70 MG PO TABS: ORAL_TABLET | ORAL | 4 refills | Status: DC

## 2019-02-18 ENCOUNTER — Other Ambulatory Visit: Payer: Self-pay | Admitting: Cardiovascular Disease

## 2019-02-18 NOTE — Telephone Encounter (Signed)
Patient scheduled in December

## 2019-02-18 NOTE — Telephone Encounter (Signed)
Pt overdue for 6 month f/u.  Please contact pt for future appointment. 

## 2019-02-19 ENCOUNTER — Other Ambulatory Visit: Payer: Self-pay | Admitting: Cardiovascular Disease

## 2019-02-25 ENCOUNTER — Other Ambulatory Visit: Payer: Self-pay | Admitting: Nurse Practitioner

## 2019-02-25 DIAGNOSIS — E756 Lipid storage disorder, unspecified: Secondary | ICD-10-CM

## 2019-02-25 DIAGNOSIS — R7301 Impaired fasting glucose: Secondary | ICD-10-CM

## 2019-02-25 DIAGNOSIS — R946 Abnormal results of thyroid function studies: Secondary | ICD-10-CM

## 2019-02-25 DIAGNOSIS — R5381 Other malaise: Secondary | ICD-10-CM

## 2019-03-01 LAB — CBC WITH DIFFERENTIAL/PLATELET
Basophils Absolute: 0 10*3/uL (ref 0.0–0.2)
Basos: 1 %
EOS (ABSOLUTE): 0.3 10*3/uL (ref 0.0–0.4)
Eos: 4 %
Hematocrit: 39.9 % (ref 34.0–46.6)
Hemoglobin: 13.2 g/dL (ref 11.1–15.9)
Immature Grans (Abs): 0 10*3/uL (ref 0.0–0.1)
Immature Granulocytes: 0 %
Lymphocytes Absolute: 2.5 10*3/uL (ref 0.7–3.1)
Lymphs: 36 %
MCH: 30.1 pg (ref 26.6–33.0)
MCHC: 33.1 g/dL (ref 31.5–35.7)
MCV: 91 fL (ref 79–97)
Monocytes Absolute: 0.4 10*3/uL (ref 0.1–0.9)
Monocytes: 6 %
Neutrophils Absolute: 3.7 10*3/uL (ref 1.4–7.0)
Neutrophils: 53 %
Platelets: 255 10*3/uL (ref 150–450)
RBC: 4.39 x10E6/uL (ref 3.77–5.28)
RDW: 13.1 % (ref 11.7–15.4)
WBC: 6.9 10*3/uL (ref 3.4–10.8)

## 2019-03-01 LAB — LIPID PANEL W/O CHOL/HDL RATIO
Cholesterol, Total: 94 mg/dL — ABNORMAL LOW (ref 100–199)
HDL: 29 mg/dL — ABNORMAL LOW (ref >39)
LDL Chol Calc (NIH): 43 mg/dL (ref 0–99)
Triglycerides: 117 mg/dL (ref 0–149)
VLDL Cholesterol Cal: 22 mg/dL (ref 5–40)

## 2019-03-01 LAB — COMPREHENSIVE METABOLIC PANEL
ALT: 24 IU/L (ref 0–32)
AST: 27 IU/L (ref 0–40)
Albumin/Globulin Ratio: 1.8 (ref 1.2–2.2)
Albumin: 4.3 g/dL (ref 3.8–4.8)
Alkaline Phosphatase: 126 IU/L — ABNORMAL HIGH (ref 39–117)
BUN/Creatinine Ratio: 23 (ref 12–28)
BUN: 16 mg/dL (ref 8–27)
Bilirubin Total: 0.3 mg/dL (ref 0.0–1.2)
CO2: 24 mmol/L (ref 20–29)
Calcium: 9.2 mg/dL (ref 8.7–10.3)
Chloride: 103 mmol/L (ref 96–106)
Creatinine, Ser: 0.71 mg/dL (ref 0.57–1.00)
GFR calc Af Amer: 106 mL/min/{1.73_m2} (ref >59)
GFR calc non Af Amer: 92 mL/min/{1.73_m2} (ref >59)
Globulin, Total: 2.4 g/dL (ref 1.5–4.5)
Glucose: 115 mg/dL — ABNORMAL HIGH (ref 65–99)
Potassium: 4.4 mmol/L (ref 3.5–5.2)
Sodium: 142 mmol/L (ref 134–144)
Total Protein: 6.7 g/dL (ref 6.0–8.5)

## 2019-03-01 LAB — HGB A1C W/O EAG: Hgb A1c MFr Bld: 6.5 % — ABNORMAL HIGH (ref 4.8–5.6)

## 2019-03-01 LAB — TSH+FREE T4
Free T4: 1.17 ng/dL (ref 0.82–1.77)
TSH: 1.96 u[IU]/mL (ref 0.450–4.500)

## 2019-03-01 NOTE — Progress Notes (Signed)
Discuss at visit 03/06/2019

## 2019-03-04 ENCOUNTER — Telehealth: Payer: Self-pay

## 2019-03-04 NOTE — Telephone Encounter (Signed)
Patient screened and confirmed appointment for 03-06-19 ov.

## 2019-03-06 ENCOUNTER — Encounter: Payer: Self-pay | Admitting: Nurse Practitioner

## 2019-03-06 ENCOUNTER — Other Ambulatory Visit: Payer: Self-pay

## 2019-03-06 ENCOUNTER — Ambulatory Visit: Payer: Commercial Managed Care - PPO | Admitting: Nurse Practitioner

## 2019-03-06 VITALS — BP 141/86 | HR 71 | Temp 97.7°F | Resp 16 | Ht 62.0 in | Wt 173.0 lb

## 2019-03-06 DIAGNOSIS — R300 Dysuria: Secondary | ICD-10-CM

## 2019-03-06 DIAGNOSIS — I10 Essential (primary) hypertension: Secondary | ICD-10-CM

## 2019-03-06 DIAGNOSIS — Z0001 Encounter for general adult medical examination with abnormal findings: Secondary | ICD-10-CM | POA: Diagnosis not present

## 2019-03-06 DIAGNOSIS — E119 Type 2 diabetes mellitus without complications: Secondary | ICD-10-CM | POA: Diagnosis not present

## 2019-03-06 DIAGNOSIS — J300 Vasomotor rhinitis: Secondary | ICD-10-CM

## 2019-03-06 DIAGNOSIS — D4861 Neoplasm of uncertain behavior of right breast: Secondary | ICD-10-CM

## 2019-03-06 MED ORDER — FLUTICASONE PROPIONATE 50 MCG/ACT NA SUSP: 1.0000 | Freq: Every day | NASAL | 5 refills | Status: DC

## 2019-03-06 MED ORDER — FARXIGA 5 MG PO TABS: 5.0000 mg | ORAL_TABLET | Freq: Every day | ORAL | 3 refills | Status: DC

## 2019-03-06 NOTE — Progress Notes (Signed)
[facility] [place], [place] [address]  Internal MEDICINE  Office Visit Note  Patient Name: Brenda Guerrero  [id number]  [id number]  Date of Service: 03/16/2019   Pt is here for routine health maintenance examination  Chief Complaint  Patient presents with  . Annual Exam  . Hypertension  . Hyperlipidemia     The patient is here for health maintenance exam. She states that she continues to have some right sided breast pain which is intermittent. She did have screening mammogram and ultrasound in 07/2018. These were found to be likely benign. Recommended ultrasound in six months. This test was ordered and just needs to be scheduled.  She had routine, fasting labs done prior to this visit. Her glucose is 115 with HgbA1c at 6.5. she does have a strong family history of diabetes and heart disease. She, herself, suffered mild heart attack in 2017. She states that she eats a very low carb, low fat diet. She eats a heart healthy diet.  She states that she is feeling well. Has no concerns or complaints other than the intermittent breast pain     Current Medication: Outpatient Encounter Medications as of 03/06/2019  Medication Sig  . alendronate (FOSAMAX) 70 MG tablet TAKE 1 TABLET BY MOUTH ONCE WEEKLY FOR OSTEOPENIA  . aspirin 81 MG chewable tablet Chew 1 tablet (81 mg total) by mouth daily.  . calcium carbonate (OS-CAL) 1250 (500 Ca) MG chewable tablet Chew 1 tablet by mouth daily.  . carvedilol (COREG) 3.125 MG tablet TAKE 1 TABLET BY MOUTH 2 (TWO) TIMES DAILY WITH A MEAL.  . cholecalciferol (VITAMIN D) 1000 units tablet Take 1,000 Units by mouth daily.  . fluticasone (FLONASE) 50 MCG/ACT nasal spray Place 1 spray into both nostrils daily.  Marland Kitchen ibuprofen (ADVIL,MOTRIN) 800 MG tablet Take by mouth.  . Multiple Vitamin (MULTI-VITAMINS) TABS Take by mouth.  . nitroGLYCERIN (NITROSTAT) 0.4 MG SL tablet Place 1 tablet (0.4 mg total) under the tongue every 5  (five) minutes as needed for chest pain.  Marland Kitchen ofloxacin (FLOXIN OTIC) 0.3 % OTIC solution Place 5 drops into the right ear daily.  . Omega-3 Fatty Acids (FISH OIL) 1000 MG CPDR Take 2,000 mg by mouth daily.  . [DISCONTINUED] fluticasone (FLONASE) 50 MCG/ACT nasal spray Place 1 spray into both nostrils daily.  . dapagliflozin propanediol (FARXIGA) 5 MG TABS tablet Take 5 mg by mouth daily before breakfast.  . rosuvastatin (CRESTOR) 40 MG tablet Take 1 tablet (40 mg total) by mouth daily.   No facility-administered encounter medications on file as of 03/06/2019.     Surgical History: Past Surgical History:  Procedure Laterality Date  . CARDIAC CATHETERIZATION N/A 05/28/2015   Procedure: Left Heart Cath and Coronary Angiography;  Surgeon: Peter M Martinique, MD;  Location: [place] [facility];  Service: Cardiovascular;  Laterality: N/A;  . CARDIAC CATHETERIZATION N/A 05/28/2015   Procedure: Coronary Stent Intervention;  Surgeon: Peter M Martinique, MD;  Location: [place] [facility];  Service: Cardiovascular;  Laterality: N/A;  . CARDIAC CATHETERIZATION N/A 05/28/2015   Procedure: Coronary Stent Intervention;  Surgeon: Peter M Martinique, MD;  Location: [place] [facility];  Service: Cardiovascular;  Laterality: N/A;  . CARDIAC CATHETERIZATION N/A 05/28/2015   Procedure: Coronary/Graft Angiography;  Surgeon: Peter M Martinique, MD;  Location: [place] [facility];  Service: Cardiovascular;  Laterality: N/A;  . CARDIAC CATHETERIZATION N/A 05/28/2015   Procedure: Intravascular Ultrasound/IVUS;  Surgeon: Peter M Martinique, MD;  Location: [place]  [facility];  Service: Cardiovascular;  Laterality: N/A;  . TUBAL LIGATION      Medical History: Past Medical History:  Diagnosis Date  . Asthma   . CAD in native artery   . Hyperlipidemia LDL goal <70   . Hypertension   . ST elevation (STEMI) myocardial infarction involving left anterior descending coronary artery ([place]) 05/28/15   with DES to LAD and return to cath lab for  restenosis requiring PTCA    Family History: Family History  Problem Relation Age of Onset  . Heart disease Father   . Heart attack Brother 42  . Breast cancer Neg Hx       Review of Systems  Constitutional: Negative for activity change, chills, fatigue and unexpected weight change.  HENT: Negative for congestion, postnasal drip, rhinorrhea, sneezing and sore throat.   Respiratory: Negative for cough, chest tightness and shortness of breath.   Cardiovascular: Negative for chest pain and palpitations.  Gastrointestinal: Negative for abdominal distention, abdominal pain, constipation, diarrhea, nausea and vomiting.  Endocrine: Negative for cold intolerance, heat intolerance, polydipsia and polyuria.  Genitourinary: Negative for flank pain, frequency, hematuria and urgency.       Bilateral breast tenderness. Small nodule noted deep in right breast tissue.   Musculoskeletal: Negative for arthralgias and myalgias.  Skin: Negative for rash.  Allergic/Immunologic: Negative for environmental allergies.  Neurological: Negative for dizziness and headaches.  Hematological: Negative for adenopathy. Does not bruise/bleed easily.  Psychiatric/Behavioral: Negative for behavioral problems (Depression), sleep disturbance and suicidal ideas. The patient is nervous/anxious.      Today's Vitals   03/06/19 1535  BP: (!) 141/86  Pulse: 71  Resp: 16  Temp: 97.7 F (36.5 C)  SpO2: 97%  Weight: 173 lb (78.5 kg)  Height: 5\' 2"  (1.575 m)   Body mass index is 31.64 kg/m.  Physical Exam Vitals signs and nursing note reviewed.  Constitutional:      General: She is not in acute distress.    Appearance: Normal appearance. She is well-developed. She is not diaphoretic.  HENT:     Head: Normocephalic and atraumatic.     Nose: Nose normal.     Mouth/Throat:     Pharynx: No oropharyngeal exudate.  Eyes:     Pupils: Pupils are equal, round, and reactive to light.  Neck:     Musculoskeletal:  Normal range of motion and neck supple.     Thyroid: No thyromegaly.     Vascular: No carotid bruit or JVD.     Trachea: No tracheal deviation.  Cardiovascular:     Rate and Rhythm: Normal rate and regular rhythm.     Pulses: Normal pulses.          Dorsalis pedis pulses are 2+ on the right side and 2+ on the left side.       Posterior tibial pulses are 2+ on the right side and 2+ on the left side.     Heart sounds: Normal heart sounds. No murmur. No friction rub. No gallop.   Pulmonary:     Effort: Pulmonary effort is normal. No respiratory distress.     Breath sounds: Normal breath sounds. No wheezing or rales.  Chest:     Breasts:        Right: Mass and tenderness present. No swelling, bleeding, inverted nipple, nipple discharge or skin change.        Left: Tenderness present. No swelling, bleeding, inverted nipple, mass, nipple discharge or skin change.    Abdominal:  General: Bowel sounds are normal.     Palpations: Abdomen is soft.     Tenderness: There is no abdominal tenderness.  Musculoskeletal: Normal range of motion.     Right foot: Normal range of motion. No deformity.     Left foot: Normal range of motion. No deformity.  Feet:     Right foot:     Protective Sensation: 10 sites tested. 10 sites sensed.     Skin integrity: Skin integrity normal.     Toenail Condition: Right toenails are normal.     Left foot:     Protective Sensation: 10 sites tested. 10 sites sensed.     Skin integrity: Skin integrity normal.     Toenail Condition: Left toenails are normal.  Lymphadenopathy:     Cervical: No cervical adenopathy.  Skin:    General: Skin is warm and dry.     Capillary Refill: Capillary refill takes less than 2 seconds.  Neurological:     Mental Status: She is alert and oriented to person, place, and time.     Cranial Nerves: No cranial nerve deficit.  Psychiatric:        Mood and Affect: Mood normal.        Behavior: Behavior normal.        Thought Content:  Thought content normal.        Judgment: Judgment normal.    LABS: Recent Results (from the past 2160 hour(s))  CBC with Differential/Platelet     Status: None   Collection Time: 02/28/19  8:19 AM  Result Value Ref Range   WBC 6.9 3.4 - 10.8 x10E3/uL   RBC 4.39 3.77 - 5.28 x10E6/uL   Hemoglobin 13.2 11.1 - 15.9 g/dL   Hematocrit 39.9 34.0 - 46.6 %   MCV 91 79 - 97 fL   MCH 30.1 26.6 - 33.0 pg   MCHC 33.1 31.5 - 35.7 g/dL   RDW 13.1 11.7 - 15.4 %   Platelets 255 150 - 450 x10E3/uL   Neutrophils 53 Not Estab. %   Lymphs 36 Not Estab. %   Monocytes 6 Not Estab. %   Eos 4 Not Estab. %   Basos 1 Not Estab. %   Neutrophils Absolute 3.7 1.4 - 7.0 x10E3/uL   Lymphocytes Absolute 2.5 0.7 - 3.1 x10E3/uL   Monocytes Absolute 0.4 0.1 - 0.9 x10E3/uL   EOS (ABSOLUTE) 0.3 0.0 - 0.4 x10E3/uL   Basophils Absolute 0.0 0.0 - 0.2 x10E3/uL   Immature Granulocytes 0 Not Estab. %   Immature Grans (Abs) 0.0 0.0 - 0.1 x10E3/uL  Comprehensive Metabolic Panel (CMET)     Status: Abnormal   Collection Time: 02/28/19  8:19 AM  Result Value Ref Range   Glucose 115 (H) 65 - 99 mg/dL   BUN 16 8 - 27 mg/dL   Creatinine, Ser 0.71 0.57 - 1.00 mg/dL   GFR calc non Af Amer 92 >59 mL/min/1.73   GFR calc Af Amer 106 >59 mL/min/1.73   BUN/Creatinine Ratio 23 12 - 28   Sodium 142 134 - 144 mmol/L   Potassium 4.4 3.5 - 5.2 mmol/L   Chloride 103 96 - 106 mmol/L   CO2 24 20 - 29 mmol/L   Calcium 9.2 8.7 - 10.3 mg/dL   Total Protein 6.7 6.0 - 8.5 g/dL   Albumin 4.3 3.8 - 4.8 g/dL   Globulin, Total 2.4 1.5 - 4.5 g/dL   Albumin/Globulin Ratio 1.8 1.2 - 2.2   Bilirubin Total 0.3 0.0 -  1.2 mg/dL   Alkaline Phosphatase 126 (H) 39 - 117 IU/L   AST 27 0 - 40 IU/L   ALT 24 0 - 32 IU/L  Lipid Panel w/o Chol/HDL Ratio     Status: Abnormal   Collection Time: 02/28/19  8:19 AM  Result Value Ref Range   Cholesterol, Total 94 (L) 100 - 199 mg/dL   Triglycerides 117 0 - 149 mg/dL   HDL 29 (L) >39 mg/dL   VLDL  Cholesterol Cal 22 5 - 40 mg/dL   LDL Chol Calc (NIH) 43 0 - 99 mg/dL  Hgb A1C w/o eAG     Status: Abnormal   Collection Time: 02/28/19  8:19 AM  Result Value Ref Range   Hgb A1c MFr Bld 6.5 (H) 4.8 - 5.6 %    Comment:          Prediabetes: 5.7 - 6.4          Diabetes: >6.4          Glycemic control for adults with diabetes: <7.0   TSH + free T4     Status: None   Collection Time: 02/28/19  8:19 AM  Result Value Ref Range   TSH 1.960 0.450 - 4.500 uIU/mL   Free T4 1.17 0.82 - 1.77 ng/dL  UA/M w/rflx Culture, Routine     Status: None   Collection Time: 03/06/19  3:39 PM   Specimen: Urine   URINE  Result Value Ref Range   Specific Gravity, UA 1.020 1.005 - 1.030   pH, UA 5.5 5.0 - 7.5   Color, UA Yellow Yellow   Appearance Ur Clear Clear   Leukocytes,UA Negative Negative   Protein,UA Negative Negative/Trace   Glucose, UA Negative Negative   Ketones, UA Negative Negative   RBC, UA Negative Negative   Bilirubin, UA Negative Negative   Urobilinogen, Ur 1.0 0.2 - 1.0 mg/dL   Nitrite, UA Negative Negative   Microscopic Examination Comment     Comment: Microscopic follows if indicated.   Microscopic Examination See below:     Comment: Microscopic was indicated and was performed.   Urinalysis Reflex Comment     Comment: This specimen will not reflex to a Urine Culture.  Microscopic Examination     Status: Abnormal   Collection Time: 03/06/19  3:39 PM   URINE  Result Value Ref Range   WBC, UA 0-5 0 - 5 /hpf   RBC None seen 0 - 2 /hpf   Epithelial Cells (non renal) None seen 0 - 10 /hpf   Casts None seen None seen /lpf   Crystals Present (A) N/A   Crystal Type Calcium Oxalate N/A   Mucus, UA Present Not Estab.   Bacteria, UA Few None seen/Few    Assessment/Plan: 1. Encounter for general adult medical examination with abnormal findings Annual health maintenance exam today.  2. Essential hypertension Stable. Continue bp medication as prescribed   3. Type 2 diabetes  mellitus without complication, without long-term current use of insulin (HCC) Recent HgbA1c is 6.5. will start farxiga 5mg  daily due to history of CAD and family history of CAD. Will monitor HgbA1c closely. She monitors her blood sugars daily at home.  - dapagliflozin propanediol (FARXIGA) 5 MG TABS tablet; Take 5 mg by mouth daily before breakfast.  Dispense: 30 tablet; Refill: 3  4. Vasomotor rhinitis - fluticasone (FLONASE) 50 MCG/ACT nasal spray; Place 1 spray into both nostrils daily.  Dispense: 48 g; Refill: 5  5. 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 These were found to be likely benign. Repeat ultrasound of area of concern to be scheduled.   6. Dysuria - UA/M w/rflx Culture, Routine  General Counseling: Cherylyn verbalizes understanding of the findings of todays visit and agrees with plan of treatment. I have discussed any further diagnostic evaluation that may be needed or ordered today. We also reviewed her medications today. she has been encouraged to call the office with any questions or concerns that should arise related to todays visit.    Counseling:  Hypertension Counseling:   The following hypertensive lifestyle modification were recommended and discussed:  1. Limiting alcohol intake to less than 1 oz/day of ethanol:(24 oz of beer or 8 oz of wine or 2 oz of 100-proof whiskey). 2. Take baby ASA 81 mg daily. 3. Importance of regular aerobic exercise and losing weight. 4. Reduce dietary saturated fat and cholesterol intake for overall cardiovascular health. 5. Maintaining adequate dietary potassium, calcium, and magnesium intake. 6. Regular monitoring of the blood pressure. 7. Reduce sodium intake to less than 100 mmol/day (less than 2.3 gm of sodium or less than 6 gm of sodium choride)   This patient was seen by [place] with Dr Lavera Guise as a part of collaborative care agreement   Orders Placed This Encounter  Procedures  . Microscopic Examination  . UA/M w/rflx Culture, Routine    Meds ordered this encounter  Medications  . dapagliflozin propanediol (FARXIGA) 5 MG TABS tablet    Sig: Take 5 mg by mouth daily before breakfast.    Dispense:  30 tablet    Refill:  3    The patient was given manufacturer copay assistance card in the office today.    Order Specific Question:   Supervising Provider    Answer:   Lavera Guise [id number]  . fluticasone (FLONASE) 50 MCG/ACT nasal spray    Sig: Place 1 spray into both nostrils daily.    Dispense:  48 g    Refill:  5    Order Specific Question:   Supervising Provider    Answer:   Lavera Guise [id number]    Time spent: [place], MD  Internal Medicine

## 2019-03-07 LAB — MICROSCOPIC EXAMINATION
Casts: NONE SEEN /lpf
Epithelial Cells (non renal): NONE SEEN /hpf (ref 0–10)
RBC, Urine: NONE SEEN /hpf (ref 0–2)

## 2019-03-07 LAB — UA/M W/RFLX CULTURE, ROUTINE
Bilirubin, UA: NEGATIVE
Glucose, UA: NEGATIVE
Ketones, UA: NEGATIVE
Leukocytes,UA: NEGATIVE
Nitrite, UA: NEGATIVE
Protein,UA: NEGATIVE
RBC, UA: NEGATIVE
Specific Gravity, UA: 1.02 (ref 1.005–1.030)
Urobilinogen, Ur: 1 mg/dL (ref 0.2–1.0)
pH, UA: 5.5 (ref 5.0–7.5)

## 2019-03-16 DIAGNOSIS — E1165 Type 2 diabetes mellitus with hyperglycemia: Secondary | ICD-10-CM | POA: Insufficient documentation

## 2019-03-16 DIAGNOSIS — E119 Type 2 diabetes mellitus without complications: Secondary | ICD-10-CM | POA: Insufficient documentation

## 2019-03-21 NOTE — Progress Notes (Signed)
Date:  03/24/2019   ID:  Brenda Guerrero, DOB 10-03-1955, MRN [id number]  Patient Location:  [address] [place] [address]   Provider location:   [facility] [place] office  PCP:  Ronnell Freshwater, NP  Cardiologist:  No primary care provider on file.   Chief Complaint  Patient presents with  . other    6 month follow up. Meds reviewed by the pt. verbally. "doing well."      History of Present Illness:    Brenda Guerrero is a 63 y.o. female  past medical history of CAD. 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    Who presents for follow-up of her coronary disease, prior STEMI  In follow-up today reports that she feels well with no chest pain Walking up to [phone] steps per day at work No regular exercise program Desperately working on her diet, weight  Works at [employer] around campus on a regular basis Conditioning is good  Hemoglobin A1c 6.5 May start diabetes medication  Total cholesterol less than 100, LDL in the 40s Dramatic improvement on Crestor 40 with no side effects  EKG personally reviewed by myself on todays visit Shows normal sinus rhythm rate 69 bpm T wave abnormality V6, 1 and aVL  Other past medical history reviewed  [facility] on 05/28/15 as an anterior STEMI.   cath  single vessel obstructive CAD, DES to LAD.   Pk troponin 4.  30 minutes of arrival in the ICU the patient had marked increase in chest pain and hyperacute ST elevation consistent with reocclusion.  taken back to the cath lab and found to have reocclusion of the vessel.  She underwent repeat stenting of the LAD and PTCA for acute stent thrombosis.  EF normal by Echo   brother has an LVAD ischemic cardiomyopathy   Previous echocardiogram detailed below discussed with her  Prior CV studies:   The following studies were reviewed today:  - Left ventricle: The cavity size was  normal. Wall thickness was   normal. Systolic function was normal. The estimated ejection   fraction was in the range of 55% to 60%. Wall motion was normal;   there were no regional wall motion abnormalities. Doppler   parameters are consistent with abnormal left ventricular   relaxation (grade 1 diastolic dysfunction).  Impressions:  - Normal LV systolic function; grade 1 diastolic dysfunction; trace   MR and TR.   Past Medical History:  Diagnosis Date  . Asthma   . CAD in native artery   . Hyperlipidemia LDL goal <70   . Hypertension   . ST elevation (STEMI) myocardial infarction involving left anterior descending coronary artery ([place]) 05/28/15   with DES to LAD and return to cath lab for restenosis requiring PTCA   Past Surgical History:  Procedure Laterality Date  . CARDIAC CATHETERIZATION N/A 05/28/2015   Procedure: Left Heart Cath and Coronary Angiography;  Surgeon: Peter M Martinique, MD;  Location: [place] [facility];  Service: Cardiovascular;  Laterality: N/A;  . CARDIAC CATHETERIZATION N/A 05/28/2015   Procedure: Coronary Stent Intervention;  Surgeon: Peter M Martinique, MD;  Location: [place] [facility];  Service: Cardiovascular;  Laterality: N/A;  . CARDIAC CATHETERIZATION N/A 05/28/2015   Procedure: Coronary Stent Intervention;  Surgeon: Peter M Martinique, MD;  Location: [place] [facility];  Service: Cardiovascular;  Laterality: N/A;  . CARDIAC  CATHETERIZATION N/A 05/28/2015   Procedure: Coronary/Graft Angiography;  Surgeon: Peter M Martinique, MD;  Location: [place] [facility];  Service: Cardiovascular;  Laterality: N/A;  . CARDIAC CATHETERIZATION N/A 05/28/2015   Procedure: Intravascular Ultrasound/IVUS;  Surgeon: Peter M Martinique, MD;  Location: [place] [facility];  Service: Cardiovascular;  Laterality: N/A;  . TUBAL LIGATION       Current Meds  Medication Sig  . alendronate (FOSAMAX) 70 MG tablet TAKE 1 TABLET BY MOUTH ONCE WEEKLY FOR OSTEOPENIA  . aspirin 81 MG chewable tablet  Chew 1 tablet (81 mg total) by mouth daily.  . calcium carbonate (OS-CAL) 1250 (500 Ca) MG chewable tablet Chew 1 tablet by mouth daily.  . carvedilol (COREG) 3.125 MG tablet TAKE 1 TABLET BY MOUTH 2 (TWO) TIMES DAILY WITH A MEAL.  . cholecalciferol (VITAMIN D) 1000 units tablet Take 1,000 Units by mouth daily.  . dapagliflozin propanediol (FARXIGA) 5 MG TABS tablet Take 5 mg by mouth daily before breakfast.  . fluticasone (FLONASE) 50 MCG/ACT nasal spray Place 1 spray into both nostrils daily.  Marland Kitchen ibuprofen (ADVIL,MOTRIN) 800 MG tablet Take 800 mg by mouth every 6 (six) hours as needed.   . Multiple Vitamin (MULTI-VITAMINS) TABS Take by mouth.  . nitroGLYCERIN (NITROSTAT) 0.4 MG SL tablet Place 1 tablet (0.4 mg total) under the tongue every 5 (five) minutes as needed for chest pain.  . Omega-3 Fatty Acids (FISH OIL) 1000 MG CPDR Take 2,000 mg by mouth daily.  . rosuvastatin (CRESTOR) 40 MG tablet Take 1 tablet (40 mg total) by mouth daily.     Allergies:   Azithromycin   Social History   Tobacco Use  . Smoking status: Former Research scientist (life sciences)  . Smokeless tobacco: Never Used  . Tobacco comment: about 40 years ago reported on 03/06/2019  Substance Use Topics  . Alcohol use: No  . Drug use: No     Current Outpatient Medications on File Prior to Visit  Medication Sig Dispense Refill  . alendronate (FOSAMAX) 70 MG tablet TAKE 1 TABLET BY MOUTH ONCE WEEKLY FOR OSTEOPENIA 12 tablet 4  . aspirin 81 MG chewable tablet Chew 1 tablet (81 mg total) by mouth daily.    . calcium carbonate (OS-CAL) 1250 (500 Ca) MG chewable tablet Chew 1 tablet by mouth daily.    . carvedilol (COREG) 3.125 MG tablet TAKE 1 TABLET BY MOUTH 2 (TWO) TIMES DAILY WITH A MEAL. 180 tablet 0  . cholecalciferol (VITAMIN D) 1000 units tablet Take 1,000 Units by mouth daily.    . dapagliflozin propanediol (FARXIGA) 5 MG TABS tablet Take 5 mg by mouth daily before breakfast. 30 tablet 3  . fluticasone (FLONASE) 50 MCG/ACT nasal spray  Place 1 spray into both nostrils daily. 48 g 5  . ibuprofen (ADVIL,MOTRIN) 800 MG tablet Take 800 mg by mouth every 6 (six) hours as needed.     . Multiple Vitamin (MULTI-VITAMINS) TABS Take by mouth.    . nitroGLYCERIN (NITROSTAT) 0.4 MG SL tablet Place 1 tablet (0.4 mg total) under the tongue every 5 (five) minutes as needed for chest pain. 25 tablet 2  . Omega-3 Fatty Acids (FISH OIL) 1000 MG CPDR Take 2,000 mg by mouth daily.    . rosuvastatin (CRESTOR) 40 MG tablet Take 1 tablet (40 mg total) by mouth daily. 90 tablet 3   No current facility-administered medications on file prior to visit.      Family Hx: The patient's family history includes Heart attack (age of onset:  35) in her brother; Heart disease in her father. There is no history of Breast cancer.  ROS:   Please see the history of present illness.    Review of Systems  Constitutional: Negative.   Respiratory: Negative.   Cardiovascular: Negative.   Gastrointestinal: Negative.   Musculoskeletal: Negative.   Neurological: Negative.   Psychiatric/Behavioral: Negative.   All other systems reviewed and are negative.    Labs/Other Tests and Data Reviewed:    Recent Labs: 02/28/2019: ALT 24; BUN 16; Creatinine, Ser 0.71; Hemoglobin 13.2; Platelets 255; Potassium 4.4; Sodium 142; TSH 1.960   Recent Lipid Panel Lab Results  Component Value Date/Time   CHOL 94 (L) 02/28/2019 08:19 AM   CHOL 145 03/15/2012 10:36 AM   TRIG 117 02/28/2019 08:19 AM   TRIG 209 (H) 03/15/2012 10:36 AM   HDL 29 (L) 02/28/2019 08:19 AM   HDL 23 (L) 03/15/2012 10:36 AM   CHOLHDL 6.1 12/28/2017 07:59 AM   LDLCALC 43 02/28/2019 08:19 AM   LDLCALC 80 03/15/2012 10:36 AM    Wt Readings from Last 3 Encounters:  03/24/19 173 lb 4 oz (78.6 kg)  03/06/19 173 lb (78.5 kg)  07/16/18 172 lb 5 oz (78.2 kg)     Exam:    Vital Signs:  BP 130/68 (BP Location: Left Arm, Patient Position: Sitting, Cuff Size: Normal)   Pulse 70   Temp (!) 96.4 F (35.8  C)   Ht 5' 2.5" (1.588 m)   Wt 173 lb 4 oz (78.6 kg)   BMI 31.18 kg/m    Constitutional:  oriented to person, place, and time. No distress.  HENT:  Head: Grossly normal Eyes:  no discharge. No scleral icterus.  Neck: No JVD, no carotid bruits  Cardiovascular: Regular rate and rhythm, no murmurs appreciated Pulmonary/Chest: Clear to auscultation bilaterally, no wheezes or rails Abdominal: Soft.  no distension.  no tenderness.  Musculoskeletal: Normal range of motion Neurological:  normal muscle tone. Coordination normal. No atrophy Skin: Skin warm and dry Psychiatric: normal affect, pleasant   ASSESSMENT & PLAN:    Atherosclerosis of native coronary artery of native heart with stable angina pectoris (HCC) Currently with no symptoms of angina. No further workup at this time. Continue current medication regimen.  Dyslipidemia Cholesterol is at goal on the current lipid regimen. No changes to the medications were made. Crestor 40  Essential hypertension Blood pressure is well controlled on today's visit. No changes made to the medications.  Diabetes  We have encouraged continued exercise, careful diet management in an effort to lose weight.   Total encounter time more than 25 minutes  Greater than 50% was spent in counseling and coordination of care with the patient  Disposition: Follow-up in 12 months  Signed, Ida Rogue, MD  03/24/2019 4:03 PM    [id number], [place], [place] [address]

## 2019-03-24 ENCOUNTER — Encounter: Payer: Self-pay | Admitting: Cardiovascular Disease

## 2019-03-24 ENCOUNTER — Other Ambulatory Visit: Payer: Self-pay

## 2019-03-24 ENCOUNTER — Ambulatory Visit (INDEPENDENT_AMBULATORY_CARE_PROVIDER_SITE_OTHER): Payer: Commercial Managed Care - PPO | Admitting: Cardiovascular Disease

## 2019-03-24 VITALS — BP 130/68 | HR 70 | Temp 96.4°F | Ht 62.5 in | Wt 173.2 lb

## 2019-03-24 DIAGNOSIS — R7303 Prediabetes: Secondary | ICD-10-CM

## 2019-03-24 DIAGNOSIS — I10 Essential (primary) hypertension: Secondary | ICD-10-CM

## 2019-03-24 DIAGNOSIS — E785 Hyperlipidemia, unspecified: Secondary | ICD-10-CM

## 2019-03-24 DIAGNOSIS — I25118 Atherosclerotic heart disease of native coronary artery with other forms of angina pectoris: Secondary | ICD-10-CM

## 2019-03-24 MED ORDER — ROSUVASTATIN CALCIUM 40 MG PO TABS: 40.0000 mg | ORAL_TABLET | Freq: Every day | ORAL | 3 refills | Status: DC

## 2019-03-24 MED ORDER — CARVEDILOL 3.125 MG PO TABS: ORAL_TABLET | ORAL | 3 refills | Status: DC

## 2019-03-24 NOTE — Patient Instructions (Signed)

## 2019-05-06 ENCOUNTER — Encounter: Payer: Self-pay | Admitting: Nurse Practitioner

## 2019-05-06 ENCOUNTER — Ambulatory Visit: Payer: Commercial Managed Care - PPO | Admitting: Nurse Practitioner

## 2019-05-06 VITALS — Ht 62.0 in | Wt 172.0 lb

## 2019-05-06 DIAGNOSIS — T50A95A Adverse effect of other bacterial vaccines, initial encounter: Secondary | ICD-10-CM | POA: Insufficient documentation

## 2019-05-06 DIAGNOSIS — R21 Rash and other nonspecific skin eruption: Secondary | ICD-10-CM | POA: Diagnosis not present

## 2019-05-06 DIAGNOSIS — L209 Atopic dermatitis, unspecified: Secondary | ICD-10-CM | POA: Diagnosis not present

## 2019-05-06 MED ORDER — METHYLPREDNISOLONE 4 MG PO TBPK: ORAL_TABLET | ORAL | 0 refills | Status: DC

## 2019-05-06 MED ORDER — TRIAMCINOLONE ACETONIDE 0.025 % EX CREA: 1.0000 "application " | TOPICAL_CREAM | Freq: Two times a day (BID) | CUTANEOUS | 2 refills | Status: DC

## 2019-05-06 NOTE — Progress Notes (Signed)
[facility] [place], [place] [address]  Internal MEDICINE  Telephone Visit  Patient Name: Brenda Guerrero  [id number]  IN:[id number]  Date of Service: 05/06/2019  I connected with the patient at 9:24am by webcam and verified the patients identity using two identifiers.   I discussed the limitations, risks, security and privacy concerns of performing an evaluation and management service by webcam and the availability of in person appointments. I also discussed with the patient that there may be a patient responsible charge related to the service.  The patient expressed understanding and agrees to proceed.    Chief Complaint  Patient presents with  . Telephone Assessment  . Telephone Screen  . Rash    pt get  moderna covid vaccine having side effects     The patient has been contacted via webcam for follow up visit due to concerns for spread of novel coronavirus. The patient presents for acute visit. The patient states that she got Moderna Vaccine for COVID 19 on April 25, 2019. She states that the night of the vaccine, she started with some redness around the injection site along with nausea and diarrhea. She states that this has gradually spread over her arms, abdomen, back, neck, and legs. She describes the rash as itchy. Rash is comprised of small, blister-like lesions. She denies fever.       Current Medication: Outpatient Encounter Medications as of 05/06/2019  Medication Sig  . alendronate (FOSAMAX) 70 MG tablet TAKE 1 TABLET BY MOUTH ONCE WEEKLY FOR OSTEOPENIA  . aspirin 81 MG chewable tablet Chew 1 tablet (81 mg total) by mouth daily.  . calcium carbonate (OS-CAL) 1250 (500 Ca) MG chewable tablet Chew 1 tablet by mouth daily.  . carvedilol (COREG) 3.125 MG tablet TAKE 1 TABLET BY MOUTH 2 (TWO) TIMES DAILY WITH A MEAL.  . cholecalciferol (VITAMIN D) 1000 units tablet Take 1,000 Units by mouth daily.  . dapagliflozin propanediol (FARXIGA) 5 MG TABS  tablet Take 5 mg by mouth daily before breakfast.  . fluticasone (FLONASE) 50 MCG/ACT nasal spray Place 1 spray into both nostrils daily.  Marland Kitchen ibuprofen (ADVIL,MOTRIN) 800 MG tablet Take 800 mg by mouth every 6 (six) hours as needed.   . Multiple Vitamin (MULTI-VITAMINS) TABS Take by mouth.  . nitroGLYCERIN (NITROSTAT) 0.4 MG SL tablet Place 1 tablet (0.4 mg total) under the tongue every 5 (five) minutes as needed for chest pain.  . Omega-3 Fatty Acids (FISH OIL) 1000 MG CPDR Take 2,000 mg by mouth daily.  . rosuvastatin (CRESTOR) 40 MG tablet Take 1 tablet (40 mg total) by mouth daily.  . methylPREDNISolone (MEDROL) 4 MG TBPK tablet Take by mouth as directed for 6 days  . triamcinolone (KENALOG) 0.025 % cream Apply 1 application topically 2 (two) times daily.   No facility-administered encounter medications on file as of 05/06/2019.    Surgical History: Past Surgical History:  Procedure Laterality Date  . CARDIAC CATHETERIZATION N/A 05/28/2015   Procedure: Left Heart Cath and Coronary Angiography;  Surgeon: Peter M Martinique, MD;  Location: [place] [facility];  Service: Cardiovascular;  Laterality: N/A;  . CARDIAC CATHETERIZATION N/A 05/28/2015   Procedure: Coronary Stent Intervention;  Surgeon: Peter M Martinique, MD;  Location: [place] [facility];  Service: Cardiovascular;  Laterality: N/A;  . CARDIAC CATHETERIZATION N/A 05/28/2015   Procedure: Coronary Stent Intervention;  Surgeon: Peter M Martinique, MD;  Location: [place] [facility];  Service: Cardiovascular;  Laterality: N/A;  . CARDIAC  CATHETERIZATION N/A 05/28/2015   Procedure: Coronary/Graft Angiography;  Surgeon: Peter M Martinique, MD;  Location: [place] [facility];  Service: Cardiovascular;  Laterality: N/A;  . CARDIAC CATHETERIZATION N/A 05/28/2015   Procedure: Intravascular Ultrasound/IVUS;  Surgeon: Peter M Martinique, MD;  Location: [place] [facility];  Service: Cardiovascular;  Laterality: N/A;  . TUBAL LIGATION      Medical History: Past  Medical History:  Diagnosis Date  . Asthma   . CAD in native artery   . Hyperlipidemia LDL goal <70   . Hypertension   . ST elevation (STEMI) myocardial infarction involving left anterior descending coronary artery ([place]) 05/28/15   with DES to LAD and return to cath lab for restenosis requiring PTCA    Family History: Family History  Problem Relation Age of Onset  . Heart disease Father   . Heart attack Brother 88  . Breast cancer Neg Hx     Social History   Socioeconomic History  . Marital status: Married    Spouse name: Not on file  . Number of children: Not on file  . Years of education: Not on file  . Highest education level: Not on file  Occupational History  . Not on file  Tobacco Use  . Smoking status: Former Research scientist (life sciences)  . Smokeless tobacco: Never Used  . Tobacco comment: about 40 years ago reported on 03/06/2019  Substance and Sexual Activity  . Alcohol use: No  . Drug use: No  . Sexual activity: Yes    Birth control/protection: Post-menopausal  Other Topics Concern  . Not on file  Social History Narrative  . Not on file   Social Determinants of Health   Financial Resource Strain:   . Difficulty of Paying Living Expenses: Not on file  Food Insecurity:   . Worried About Charity fundraiser in the Last Year: Not on file  . Ran Out of Food in the Last Year: Not on file  Transportation Needs:   . Lack of Transportation (Medical): Not on file  . Lack of Transportation (Non-Medical): Not on file  Physical Activity:   . Days of Exercise per Week: Not on file  . Minutes of Exercise per Session: Not on file  Stress:   . Feeling of Stress : Not on file  Social Connections:   . Frequency of Communication with Friends and Family: Not on file  . Frequency of Social Gatherings with Friends and Family: Not on file  . Attends Religious Services: Not on file  . Active Member of Clubs or Organizations: Not on file  . Attends Archivist Meetings: Not on file   . Marital Status: Not on file  Intimate Partner Violence:   . Fear of Current or Ex-Partner: Not on file  . Emotionally Abused: Not on file  . Physically Abused: Not on file  . Sexually Abused: Not on file      Review of Systems  Constitutional: Negative for chills and fatigue.  HENT: Negative for congestion, postnasal drip, rhinorrhea, sneezing and sore throat.   Respiratory: Negative for cough, chest tightness, shortness of breath and wheezing.   Cardiovascular: Negative for chest pain and palpitations.  Gastrointestinal: Negative for abdominal pain, constipation, diarrhea, nausea and vomiting.  Musculoskeletal: Negative for arthralgias, back pain, joint swelling and neck pain.  Skin: Positive for rash.       Blister-like rash, started at injection site of COVID 19 vaccine and has gradually spread to many parts of the body.  Neurological: Negative for dizziness, tremors, numbness and headaches.  Hematological: Negative for adenopathy. Does not bruise/bleed easily.  Psychiatric/Behavioral: Negative for behavioral problems (Depression), sleep disturbance and suicidal ideas. The patient is not nervous/anxious.     Today's Vitals   05/06/19 0912  Weight: 172 lb (78 kg)  Height: 5\' 2"  (1.575 m)   Body mass index is 31.46 kg/m.  Observation/Objective:   The patient is alert and oriented. She is pleasant and answers all questions appropriately. Breathing is non-labored. She is in no acute distress at this time. The patient appears worried.    Assessment/Plan: 1. Atopic dermatitis, unspecified type Medrol dose pack - take as directed for 6 days. Add triamcinolone cream twice daily as needed for itching and irritation.  - methylPREDNISolone (MEDROL) 4 MG TBPK tablet; Take by mouth as directed for 6 days  Dispense: 21 tablet; Refill: 0 - triamcinolone (KENALOG) 0.025 % cream; Apply 1 application topically 2 (two) times daily.  Dispense: 80 g; Refill: 2  2. Rash and nonspecific  skin eruption Medrol dose pack - take as directed for 6 days. Add triamcinolone cream twice daily as needed for itching and irritation.   3. Adverse reaction to diphtheria vaccine, initial encounter Unsure if new rash is adverse reaction or expected side effect of initial vaccine for COVID 19 which was given 04-25-2019. Will monitor. Advised patient to ask vaccine administrators prior to receiving second dose.   General Counseling: kamani schroeder understanding of the findings of today's phone visit and agrees with plan of treatment. I have discussed any further diagnostic evaluation that may be needed or ordered today. We also reviewed her medications today. she has been encouraged to call the office with any questions or concerns that should arise related to todays visit.   This patient was seen by [place] with Dr Lavera Guise as a part of collaborative care agreement  Meds ordered this encounter  Medications  . methylPREDNISolone (MEDROL) 4 MG TBPK tablet    Sig: Take by mouth as directed for 6 days    Dispense:  21 tablet    Refill:  0    Order Specific Question:   Supervising Provider    Answer:   Lavera Guise [place]  . triamcinolone (KENALOG) 0.025 % cream    Sig: Apply 1 application topically 2 (two) times daily.    Dispense:  80 g    Refill:  2    Order Specific Question:   Supervising Provider    Answer:   Lavera Guise [id number]    Time spent: 25 Minutes    Dr Lavera Guise Internal medicine

## 2019-06-04 ENCOUNTER — Telehealth: Payer: Self-pay

## 2019-06-04 NOTE — Telephone Encounter (Signed)
LMOM FOR PATIENT TO CONFIRM AND SCREEN FOR 06-06-19 OV.

## 2019-06-04 NOTE — Telephone Encounter (Signed)
Patient rescheduled appointment on 06/06/2019 to 06/16/2019. klh

## 2019-06-06 ENCOUNTER — Ambulatory Visit: Payer: Commercial Managed Care - PPO | Admitting: Nurse Practitioner

## 2019-06-08 ENCOUNTER — Other Ambulatory Visit: Payer: Self-pay | Admitting: Nurse Practitioner

## 2019-06-08 DIAGNOSIS — E119 Type 2 diabetes mellitus without complications: Secondary | ICD-10-CM

## 2019-06-16 ENCOUNTER — Ambulatory Visit: Payer: Commercial Managed Care - PPO | Admitting: Nurse Practitioner

## 2019-06-27 ENCOUNTER — Telehealth: Payer: Self-pay

## 2019-06-27 NOTE — Telephone Encounter (Signed)
CONFIRMED AND SCREENED FOR 07-01-19 OV. 

## 2019-06-30 ENCOUNTER — Telehealth: Payer: Self-pay

## 2019-06-30 NOTE — Telephone Encounter (Signed)
Patient rescheduled appointment on 07/01/2019 to 07/22/2019. klh

## 2019-07-01 ENCOUNTER — Ambulatory Visit: Payer: Commercial Managed Care - PPO | Admitting: Nurse Practitioner

## 2019-07-15 ENCOUNTER — Telehealth: Payer: Self-pay

## 2019-07-15 NOTE — Telephone Encounter (Signed)
Rescheduled appointment on 07/22/2019 to 08/08/2019 due to patients work. Brenda Guerrero

## 2019-07-22 ENCOUNTER — Ambulatory Visit: Payer: Commercial Managed Care - PPO | Admitting: Nurse Practitioner

## 2019-08-06 ENCOUNTER — Telehealth: Payer: Self-pay

## 2019-08-06 NOTE — Telephone Encounter (Signed)
lmom for patient to confirm and screen for 08-08-19 ov.

## 2019-08-08 ENCOUNTER — Ambulatory Visit: Payer: Commercial Managed Care - PPO | Admitting: Nurse Practitioner

## 2019-08-08 ENCOUNTER — Encounter: Payer: Self-pay | Admitting: Nurse Practitioner

## 2019-08-08 ENCOUNTER — Other Ambulatory Visit: Payer: Self-pay

## 2019-08-08 VITALS — BP 140/80 | HR 70 | Temp 97.3°F | Resp 16 | Ht 62.0 in | Wt 170.6 lb

## 2019-08-08 DIAGNOSIS — I2510 Atherosclerotic heart disease of native coronary artery without angina pectoris: Secondary | ICD-10-CM

## 2019-08-08 DIAGNOSIS — E119 Type 2 diabetes mellitus without complications: Secondary | ICD-10-CM | POA: Diagnosis not present

## 2019-08-08 DIAGNOSIS — I10 Essential (primary) hypertension: Secondary | ICD-10-CM | POA: Diagnosis not present

## 2019-08-08 LAB — POCT GLYCOSYLATED HEMOGLOBIN (HGB A1C): Hemoglobin A1C: 6.3 % — AB (ref 4.0–5.6)

## 2019-08-08 NOTE — Progress Notes (Signed)
[facility] [place], [place] [address]  Internal MEDICINE  Office Visit Note  Patient Name: Brenda Guerrero  [id number]  IN:[id number]  Date of Service: 08/08/2019  Chief Complaint  Patient presents with  . Follow-up    The patient is here for routine follow up. She was started on Farxiga 5mg  daily due to HgbA1c of 6.5 and history of coronary artery disease. She has been recording her blood sugars every morning. Blood sugars are running between 100 and 130. HgbA1c is 6.3 today. She states that she is feeling well. She has had no negative side effects from taking farxiga.  She did get both Moderna COCID 19 vaccines. Second one is documented in her immunization record.       Current Medication: Outpatient Encounter Medications as of 08/08/2019  Medication Sig  . alendronate (FOSAMAX) 70 MG tablet TAKE 1 TABLET BY MOUTH ONCE WEEKLY FOR OSTEOPENIA  . aspirin 81 MG chewable tablet Chew 1 tablet (81 mg total) by mouth daily.  . calcium carbonate (OS-CAL) 1250 (500 Ca) MG chewable tablet Chew 1 tablet by mouth daily.  . carvedilol (COREG) 3.125 MG tablet TAKE 1 TABLET BY MOUTH 2 (TWO) TIMES DAILY WITH A MEAL.  . cholecalciferol (VITAMIN D) 1000 units tablet Take 1,000 Units by mouth daily.  Marland Kitchen FARXIGA 5 MG TABS tablet TAKE 1 TAB BY MOUTH DAILY BEFORE BREAKFAST.  . fluticasone (FLONASE) 50 MCG/ACT nasal spray Place 1 spray into both nostrils daily.  Marland Kitchen ibuprofen (ADVIL,MOTRIN) 800 MG tablet Take 800 mg by mouth every 6 (six) hours as needed.   . Multiple Vitamin (MULTI-VITAMINS) TABS Take by mouth.  . nitroGLYCERIN (NITROSTAT) 0.4 MG SL tablet Place 1 tablet (0.4 mg total) under the tongue every 5 (five) minutes as needed for chest pain.  . Omega-3 Fatty Acids (FISH OIL) 1000 MG CPDR Take 2,000 mg by mouth daily.  Marland Kitchen triamcinolone (KENALOG) 0.025 % cream Apply 1 application topically 2 (two) times daily.  . methylPREDNISolone (MEDROL) 4 MG TBPK tablet Take by  mouth as directed for 6 days (Patient not taking: Reported on 08/08/2019)  . rosuvastatin (CRESTOR) 40 MG tablet Take 1 tablet (40 mg total) by mouth daily.   No facility-administered encounter medications on file as of 08/08/2019.    Surgical History: Past Surgical History:  Procedure Laterality Date  . CARDIAC CATHETERIZATION N/A 05/28/2015   Procedure: Left Heart Cath and Coronary Angiography;  Surgeon: Peter M Martinique, MD;  Location: [place] [facility];  Service: Cardiovascular;  Laterality: N/A;  . CARDIAC CATHETERIZATION N/A 05/28/2015   Procedure: Coronary Stent Intervention;  Surgeon: Peter M Martinique, MD;  Location: [place] [facility];  Service: Cardiovascular;  Laterality: N/A;  . CARDIAC CATHETERIZATION N/A 05/28/2015   Procedure: Coronary Stent Intervention;  Surgeon: Peter M Martinique, MD;  Location: [place] [facility];  Service: Cardiovascular;  Laterality: N/A;  . CARDIAC CATHETERIZATION N/A 05/28/2015   Procedure: Coronary/Graft Angiography;  Surgeon: Peter M Martinique, MD;  Location: [place] [facility];  Service: Cardiovascular;  Laterality: N/A;  . CARDIAC CATHETERIZATION N/A 05/28/2015   Procedure: Intravascular Ultrasound/IVUS;  Surgeon: Peter M Martinique, MD;  Location: [place] [facility];  Service: Cardiovascular;  Laterality: N/A;  . TUBAL LIGATION      Medical History: Past Medical History:  Diagnosis Date  . Asthma   . CAD in native artery   . Hyperlipidemia LDL goal <70   . Hypertension   . ST elevation (STEMI) myocardial infarction involving  left anterior descending coronary artery ([place]) 05/28/15   with DES to LAD and return to cath lab for restenosis requiring PTCA    Family History: Family History  Problem Relation Age of Onset  . Heart disease Father   . Heart attack Brother 60  . Breast cancer Neg Hx     Social History   Socioeconomic History  . Marital status: Married    Spouse name: Not on file  . Number of children: Not on file  . Years of education: Not  on file  . Highest education level: Not on file  Occupational History  . Not on file  Tobacco Use  . Smoking status: Former Research scientist (life sciences)  . Smokeless tobacco: Never Used  . Tobacco comment: about 40 years ago reported on 03/06/2019  Substance and Sexual Activity  . Alcohol use: No  . Drug use: No  . Sexual activity: Yes    Birth control/protection: Post-menopausal  Other Topics Concern  . Not on file  Social History Narrative  . Not on file   Social Determinants of Health   Financial Resource Strain:   . Difficulty of Paying Living Expenses:   Food Insecurity:   . Worried About Charity fundraiser in the Last Year:   . Arboriculturist in the Last Year:   Transportation Needs:   . Film/video editor (Medical):   Marland Kitchen Lack of Transportation (Non-Medical):   Physical Activity:   . Days of Exercise per Week:   . Minutes of Exercise per Session:   Stress:   . Feeling of Stress :   Social Connections:   . Frequency of Communication with Friends and Family:   . Frequency of Social Gatherings with Friends and Family:   . Attends Religious Services:   . Active Member of Clubs or Organizations:   . Attends Archivist Meetings:   Marland Kitchen Marital Status:   Intimate Partner Violence:   . Fear of Current or Ex-Partner:   . Emotionally Abused:   Marland Kitchen Physically Abused:   . Sexually Abused:       Review of Systems  Constitutional: Negative for activity change, chills and fatigue.  HENT: Negative for congestion, postnasal drip, rhinorrhea, sneezing and sore throat.   Respiratory: Negative for cough, chest tightness, shortness of breath and wheezing.   Cardiovascular: Negative for chest pain and palpitations.  Gastrointestinal: Negative for abdominal pain, constipation, diarrhea, nausea and vomiting.  Endocrine: Negative for cold intolerance, heat intolerance, polydipsia and polyuria.       Blood sugars are doing very well with addition of Farxiga 5mg  daily.   Musculoskeletal:  Negative for arthralgias, back pain, joint swelling and neck pain.  Skin: Negative for rash.  Neurological: Negative for dizziness, tremors, numbness and headaches.  Hematological: Negative for adenopathy. Does not bruise/bleed easily.  Psychiatric/Behavioral: Negative for behavioral problems (Depression), sleep disturbance and suicidal ideas. The patient is not nervous/anxious.     Today's Vitals   08/08/19 1042  BP: 140/80  Pulse: 70  Resp: 16  Temp: (!) 97.3 F (36.3 C)  SpO2: 98%  Weight: 170 lb 9.6 oz (77.4 kg)  Height: 5\' 2"  (1.575 m)   Body mass index is 31.2 kg/m.  Physical Exam Vitals and nursing note reviewed.  Constitutional:      General: She is not in acute distress.    Appearance: Normal appearance. She is well-developed. She is not diaphoretic.  HENT:     Head: Normocephalic and atraumatic.  Mouth/Throat:     Pharynx: No oropharyngeal exudate.  Eyes:     Pupils: Pupils are equal, round, and reactive to light.  Neck:     Thyroid: No thyromegaly.     Vascular: No JVD.     Trachea: No tracheal deviation.  Cardiovascular:     Rate and Rhythm: Normal rate and regular rhythm.     Heart sounds: Normal heart sounds. No murmur. No friction rub. No gallop.   Pulmonary:     Effort: Pulmonary effort is normal. No respiratory distress.     Breath sounds: Normal breath sounds. No wheezing or rales.  Chest:     Chest wall: No tenderness.  Abdominal:     Palpations: Abdomen is soft.  Musculoskeletal:        General: Normal range of motion.     Cervical back: Normal range of motion and neck supple.  Lymphadenopathy:     Cervical: No cervical adenopathy.  Skin:    General: Skin is warm and dry.  Neurological:     Mental Status: She is alert and oriented to person, place, and time.     Cranial Nerves: No cranial nerve deficit.  Psychiatric:        Mood and Affect: Mood normal.        Behavior: Behavior normal.        Thought Content: Thought content normal.         Judgment: Judgment normal.    Assessment/Plan: 1. Type 2 diabetes mellitus without complication, without long-term current use of insulin (HCC) - POCT HgB A1C 6.3 today. Continue farxiga 5mg  daily. Monitor blood sugars every day. Bring log to next routine visit.   2. Essential hypertension Stable. Continue bp medication as prescribed .  3. Coronary artery disease involving native coronary artery of native heart without angina pectoris Stable. Continue regular visits with cardiology as scheduled   General Counseling: Brenda Guerrero understanding of the findings of todays visit and agrees with plan of treatment. I have discussed any further diagnostic evaluation that may be needed or ordered today. We also reviewed her medications today. she has been encouraged to call the office with any questions or concerns that should arise related to todays visit.  Diabetes Counseling:  1. Addition of ACE inh/ ARB'S for nephroprotection. Microalbumin is updated  2. Diabetic foot care, prevention of complications. Podiatry consult 3. Exercise and lose weight.  4. Diabetic eye examination, Diabetic eye exam is updated  5. Monitor blood sugar closlely. nutrition counseling.  6. Sign and symptoms of hypoglycemia including shaking sweating,confusion and headaches.  This patient was seen by Leretha Pol FNP Collaboration with Dr Lavera Guise as a part of collaborative care agreement  Orders Placed This Encounter  Procedures  . POCT HgB A1C     Total time spent: 30 Minutes   Time spent includes review of chart, medications, test results, and follow up plan with the patient.      Dr Lavera Guise Internal medicine

## 2019-10-14 ENCOUNTER — Other Ambulatory Visit: Payer: Self-pay | Admitting: Nurse Practitioner

## 2019-10-14 DIAGNOSIS — N6001 Solitary cyst of right breast: Secondary | ICD-10-CM

## 2019-10-14 DIAGNOSIS — Z1231 Encounter for screening mammogram for malignant neoplasm of breast: Secondary | ICD-10-CM

## 2019-11-05 ENCOUNTER — Telehealth: Payer: Self-pay

## 2019-11-05 NOTE — Telephone Encounter (Signed)
Lmom to confirm and screen for 11-07-19 ov.

## 2019-11-07 ENCOUNTER — Ambulatory Visit: Payer: Commercial Managed Care - PPO | Admitting: Nurse Practitioner

## 2019-11-07 ENCOUNTER — Ambulatory Visit
Admission: RE | Admit: 2019-11-07 | Discharge: 2019-11-07 | Disposition: A | Discharge status: Home / Self Care | Payer: Commercial Managed Care - PPO | Source: Ambulatory Visit | Attending: Nurse Practitioner | Admitting: Nurse Practitioner

## 2019-11-07 ENCOUNTER — Encounter: Payer: Self-pay | Admitting: Nurse Practitioner

## 2019-11-07 ENCOUNTER — Other Ambulatory Visit: Payer: Self-pay

## 2019-11-07 VITALS — BP 136/82 | HR 68 | Temp 97.4°F | Resp 16 | Ht 62.0 in | Wt 173.0 lb

## 2019-11-07 DIAGNOSIS — I2510 Atherosclerotic heart disease of native coronary artery without angina pectoris: Secondary | ICD-10-CM

## 2019-11-07 DIAGNOSIS — Z1231 Encounter for screening mammogram for malignant neoplasm of breast: Secondary | ICD-10-CM | POA: Diagnosis present

## 2019-11-07 DIAGNOSIS — N6001 Solitary cyst of right breast: Secondary | ICD-10-CM | POA: Diagnosis present

## 2019-11-07 DIAGNOSIS — E1165 Type 2 diabetes mellitus with hyperglycemia: Secondary | ICD-10-CM | POA: Diagnosis not present

## 2019-11-07 DIAGNOSIS — I10 Essential (primary) hypertension: Secondary | ICD-10-CM | POA: Diagnosis not present

## 2019-11-07 LAB — POCT GLYCOSYLATED HEMOGLOBIN (HGB A1C): Hemoglobin A1C: 6.2 % — AB (ref 4.0–5.6)

## 2019-11-07 NOTE — Progress Notes (Signed)
Benign breast ultrasound

## 2019-11-07 NOTE — Progress Notes (Signed)
benign

## 2019-11-07 NOTE — Progress Notes (Signed)
[facility] [place], [place] [address]  Internal MEDICINE  Office Visit Note  Patient Name: Brenda Guerrero  [id number]  [id number]  Date of Service: 12/06/2019  Chief Complaint  Patient presents with  . Hyperlipidemia    follow up  . Hypertension  . Diabetes    The patient is here for routine follow up. She states that she is doing well and has no concerns or complaints today. She was started on farxiga at recent visit due to elevated blood sugars and her history of coronary artery disease. Today, HgbA1c is 6.2. she states that she is tolerating this medication as prescribed. Her blood pressure is well managed. She contiues to see cardiology routinely.       Current Medication: Outpatient Encounter Medications as of 11/07/2019  Medication Sig  . alendronate (FOSAMAX) 70 MG tablet TAKE 1 TABLET BY MOUTH ONCE WEEKLY FOR OSTEOPENIA  . aspirin 81 MG chewable tablet Chew 1 tablet (81 mg total) by mouth daily.  . calcium carbonate (OS-CAL) 1250 (500 Ca) MG chewable tablet Chew 1 tablet by mouth daily.  . carvedilol (COREG) 3.125 MG tablet TAKE 1 TABLET BY MOUTH 2 (TWO) TIMES DAILY WITH A MEAL.  . cholecalciferol (VITAMIN D) 1000 units tablet Take 1,000 Units by mouth daily.  Marland Kitchen FARXIGA 5 MG TABS tablet TAKE 1 TAB BY MOUTH DAILY BEFORE BREAKFAST.  . fluticasone (FLONASE) 50 MCG/ACT nasal spray Place 1 spray into both nostrils daily.  Marland Kitchen ibuprofen (ADVIL,MOTRIN) 800 MG tablet Take 800 mg by mouth every 6 (six) hours as needed.   . Multiple Vitamin (MULTI-VITAMINS) TABS Take by mouth.  . nitroGLYCERIN (NITROSTAT) 0.4 MG SL tablet Place 1 tablet (0.4 mg total) under the tongue every 5 (five) minutes as needed for chest pain.  . Omega-3 Fatty Acids (FISH OIL) 1000 MG CPDR Take 2,000 mg by mouth daily.  Marland Kitchen triamcinolone (KENALOG) 0.025 % cream Apply 1 application topically 2 (two) times daily.  . [DISCONTINUED] methylPREDNISolone (MEDROL) 4 MG TBPK tablet Take  by mouth as directed for 6 days  . rosuvastatin (CRESTOR) 40 MG tablet Take 1 tablet (40 mg total) by mouth daily.   No facility-administered encounter medications on file as of 11/07/2019.    Surgical History: Past Surgical History:  Procedure Laterality Date  . CARDIAC CATHETERIZATION N/A 05/28/2015   Procedure: Left Heart Cath and Coronary Angiography;  Surgeon: Peter M Martinique, MD;  Location: [place] [facility];  Service: Cardiovascular;  Laterality: N/A;  . CARDIAC CATHETERIZATION N/A 05/28/2015   Procedure: Coronary Stent Intervention;  Surgeon: Peter M Martinique, MD;  Location: [place] [facility];  Service: Cardiovascular;  Laterality: N/A;  . CARDIAC CATHETERIZATION N/A 05/28/2015   Procedure: Coronary Stent Intervention;  Surgeon: Peter M Martinique, MD;  Location: [place] [facility];  Service: Cardiovascular;  Laterality: N/A;  . CARDIAC CATHETERIZATION N/A 05/28/2015   Procedure: Coronary/Graft Angiography;  Surgeon: Peter M Martinique, MD;  Location: [place] [facility];  Service: Cardiovascular;  Laterality: N/A;  . CARDIAC CATHETERIZATION N/A 05/28/2015   Procedure: Intravascular Ultrasound/IVUS;  Surgeon: Peter M Martinique, MD;  Location: [place] [facility];  Service: Cardiovascular;  Laterality: N/A;  . TUBAL LIGATION      Medical History: Past Medical History:  Diagnosis Date  . Asthma   . CAD in native artery   . Diabetes mellitus without complication ([place])   . Hyperlipidemia LDL goal <70   . Hypertension   . ST elevation (STEMI) myocardial infarction  involving left anterior descending coronary artery ([place]) 05/28/15   with DES to LAD and return to cath lab for restenosis requiring PTCA    Family History: Family History  Problem Relation Age of Onset  . Heart disease Father   . Heart attack Brother 79  . Breast cancer Neg Hx     Social History   Socioeconomic History  . Marital status: Married    Spouse name: Not on file  . Number of children: Not on file  . Years of  education: Not on file  . Highest education level: Not on file  Occupational History  . Not on file  Tobacco Use  . Smoking status: Former Research scientist (life sciences)  . Smokeless tobacco: Never Used  . Tobacco comment: about 40 years ago reported on 03/06/2019  Substance and Sexual Activity  . Alcohol use: No  . Drug use: No  . Sexual activity: Yes    Birth control/protection: Post-menopausal  Other Topics Concern  . Not on file  Social History Narrative  . Not on file   Social Determinants of Health   Financial Resource Strain:   . Difficulty of Paying Living Expenses: Not on file  Food Insecurity:   . Worried About Charity fundraiser in the Last Year: Not on file  . Ran Out of Food in the Last Year: Not on file  Transportation Needs:   . Lack of Transportation (Medical): Not on file  . Lack of Transportation (Non-Medical): Not on file  Physical Activity:   . Days of Exercise per Week: Not on file  . Minutes of Exercise per Session: Not on file  Stress:   . Feeling of Stress : Not on file  Social Connections:   . Frequency of Communication with Friends and Family: Not on file  . Frequency of Social Gatherings with Friends and Family: Not on file  . Attends Religious Services: Not on file  . Active Member of Clubs or Organizations: Not on file  . Attends Archivist Meetings: Not on file  . Marital Status: Not on file  Intimate Partner Violence:   . Fear of Current or Ex-Partner: Not on file  . Emotionally Abused: Not on file  . Physically Abused: Not on file  . Sexually Abused: Not on file      Review of Systems  Constitutional: Negative for activity change, chills and fatigue.  HENT: Negative for congestion, postnasal drip, rhinorrhea, sneezing and sore throat.   Respiratory: Negative for cough, chest tightness, shortness of breath and wheezing.   Cardiovascular: Negative for chest pain and palpitations.  Gastrointestinal: Negative for abdominal pain, constipation,  diarrhea, nausea and vomiting.  Endocrine: Negative for cold intolerance, heat intolerance, polydipsia and polyuria.       Blood sugars are doing very well with addition of Farxiga 5mg  daily.   Musculoskeletal: Negative for arthralgias, back pain, joint swelling and neck pain.  Skin: Negative for rash.  Neurological: Negative for dizziness, tremors, numbness and headaches.  Hematological: Negative for adenopathy. Does not bruise/bleed easily.  Psychiatric/Behavioral: Negative for behavioral problems (Depression), sleep disturbance and suicidal ideas. The patient is not nervous/anxious.     Today's Vitals   11/07/19 1537  BP: (!) 136/82  Pulse: 68  Resp: 16  Temp: (!) 97.4 F (36.3 C)  SpO2: 95%  Weight: 173 lb (78.5 kg)   Body mass index is 31.64 kg/m.  Physical Exam Vitals and nursing note reviewed.  Constitutional:      General: She is  not in acute distress.    Appearance: Normal appearance. She is well-developed. She is not diaphoretic.  HENT:     Head: Normocephalic and atraumatic.     Nose: Nose normal.     Mouth/Throat:     Pharynx: No oropharyngeal exudate.  Eyes:     Pupils: Pupils are equal, round, and reactive to light.  Neck:     Thyroid: No thyromegaly.     Vascular: No carotid bruit or JVD.     Trachea: No tracheal deviation.  Cardiovascular:     Rate and Rhythm: Normal rate and regular rhythm.     Heart sounds: Normal heart sounds. No murmur heard.  No friction rub. No gallop.   Pulmonary:     Effort: Pulmonary effort is normal. No respiratory distress.     Breath sounds: Normal breath sounds. No wheezing or rales.  Chest:     Chest wall: No tenderness.  Abdominal:     Palpations: Abdomen is soft.  Musculoskeletal:        General: Normal range of motion.     Cervical back: Normal range of motion and neck supple.  Lymphadenopathy:     Cervical: No cervical adenopathy.  Skin:    General: Skin is warm and dry.  Neurological:     Mental Status: She  is alert and oriented to person, place, and time.     Cranial Nerves: No cranial nerve deficit.  Psychiatric:        Mood and Affect: Mood normal.        Behavior: Behavior normal.        Thought Content: Thought content normal.        Judgment: Judgment normal.   Assessment/Plan: 1. Type 2 diabetes mellitus with hyperglycemia, without long-term current use of insulin (HCC) - POCT HgB A1C 6.2 today. Continue farxiga as prescribed.   2. Essential hypertension Stable. Continue bp medication as prescribed.   3. Coronary artery disease involving native coronary artery of native heart without angina pectoris Well managed. Continue regular visits with cardiology as scheduled.   General Counseling: sigrid schwebach understanding of the findings of todays visit and agrees with plan of treatment. I have discussed any further diagnostic evaluation that may be needed or ordered today. We also reviewed her medications today. she has been encouraged to call the office with any questions or concerns that should arise related to todays visit.  This patient was seen by Leretha Pol FNP Collaboration with Dr Lavera Guise as a part of collaborative care agreement  Orders Placed This Encounter  Procedures  . POCT HgB A1C      Total time spent: 30 Minutes   Time spent includes review of chart, medications, test results, and follow up plan with the patient.      Dr Lavera Guise Internal medicine

## 2020-01-05 ENCOUNTER — Encounter: Payer: Self-pay | Admitting: Internal Medicine

## 2020-01-05 ENCOUNTER — Ambulatory Visit: Payer: Commercial Managed Care - PPO | Admitting: Internal Medicine

## 2020-01-05 VITALS — Temp 100.2°F | Ht 62.0 in | Wt 172.0 lb

## 2020-01-05 DIAGNOSIS — R5081 Fever presenting with conditions classified elsewhere: Secondary | ICD-10-CM | POA: Diagnosis not present

## 2020-01-05 DIAGNOSIS — J0100 Acute maxillary sinusitis, unspecified: Secondary | ICD-10-CM

## 2020-01-05 MED ORDER — AZITHROMYCIN 250 MG PO TABS
ORAL_TABLET | ORAL | 0 refills | Status: DC
Start: 2020-01-05 — End: 2020-03-08

## 2020-01-05 NOTE — Progress Notes (Signed)
[facility] [place], [place] [address]  Internal MEDICINE  Telephone Visit  Patient Name: Brenda Guerrero  [id number]  [id number]  Date of Service: 01/06/2020  I connected with the patient at 53 am  by telephone and verified the patients identity using two identifiers.   I discussed the limitations, risks, security and privacy concerns of performing an evaluation and management service by telephone and the availability of in person appointments. I also discussed with the patient that there may be a patient responsible charge related to the service.  The patient expressed understanding and agrees to proceed.    Chief Complaint  Patient presents with  . Telephone Assessment    phone [phone]  . Telephone Screen    start yesterday   . Sinusitis  . Sore Throat  . Fever  . Headache    HPI  C/o head pounding, nasal congestion, fever  No exposure to covid She has CAD/ sp angioplasty . Denies any sob or wheezing   Current Medication: Outpatient Encounter Medications as of 01/05/2020  Medication Sig  . alendronate (FOSAMAX) 70 MG tablet TAKE 1 TABLET BY MOUTH ONCE WEEKLY FOR OSTEOPENIA  . aspirin 81 MG chewable tablet Chew 1 tablet (81 mg total) by mouth daily.  . calcium carbonate (OS-CAL) 1250 (500 Ca) MG chewable tablet Chew 1 tablet by mouth daily.  . carvedilol (COREG) 3.125 MG tablet TAKE 1 TABLET BY MOUTH 2 (TWO) TIMES DAILY WITH A MEAL.  . cholecalciferol (VITAMIN D) 1000 units tablet Take 1,000 Units by mouth daily.  Marland Kitchen FARXIGA 5 MG TABS tablet TAKE 1 TAB BY MOUTH DAILY BEFORE BREAKFAST.  . fluticasone (FLONASE) 50 MCG/ACT nasal spray Place 1 spray into both nostrils daily.  Marland Kitchen ibuprofen (ADVIL,MOTRIN) 800 MG tablet Take 800 mg by mouth every 6 (six) hours as needed.   . Multiple Vitamin (MULTI-VITAMINS) TABS Take by mouth.  . nitroGLYCERIN (NITROSTAT) 0.4 MG SL tablet Place 1 tablet (0.4 mg total) under the tongue every 5 (five) minutes  as needed for chest pain.  . Omega-3 Fatty Acids (FISH OIL) 1000 MG CPDR Take 2,000 mg by mouth daily.  Marland Kitchen triamcinolone (KENALOG) 0.025 % cream Apply 1 application topically 2 (two) times daily.  Marland Kitchen azithromycin (ZITHROMAX) 250 MG tablet Use as directed  . rosuvastatin (CRESTOR) 40 MG tablet Take 1 tablet (40 mg total) by mouth daily.   No facility-administered encounter medications on file as of 01/05/2020.    Surgical History: Past Surgical History:  Procedure Laterality Date  . CARDIAC CATHETERIZATION N/A 05/28/2015   Procedure: Left Heart Cath and Coronary Angiography;  Surgeon: Peter M Martinique, MD;  Location: [place] [facility];  Service: Cardiovascular;  Laterality: N/A;  . CARDIAC CATHETERIZATION N/A 05/28/2015   Procedure: Coronary Stent Intervention;  Surgeon: Peter M Martinique, MD;  Location: [place] [facility];  Service: Cardiovascular;  Laterality: N/A;  . CARDIAC CATHETERIZATION N/A 05/28/2015   Procedure: Coronary Stent Intervention;  Surgeon: Peter M Martinique, MD;  Location: [place] [facility];  Service: Cardiovascular;  Laterality: N/A;  . CARDIAC CATHETERIZATION N/A 05/28/2015   Procedure: Coronary/Graft Angiography;  Surgeon: Peter M Martinique, MD;  Location: [place] [facility];  Service: Cardiovascular;  Laterality: N/A;  . CARDIAC CATHETERIZATION N/A 05/28/2015   Procedure: Intravascular Ultrasound/IVUS;  Surgeon: Peter M Martinique, MD;  Location: [place] [facility];  Service: Cardiovascular;  Laterality: N/A;  . TUBAL LIGATION      Medical History: Past Medical History:  Diagnosis  Date  . Asthma   . CAD in native artery   . Diabetes mellitus without complication ([place])   . Hyperlipidemia LDL goal <70   . Hypertension   . ST elevation (STEMI) myocardial infarction involving left anterior descending coronary artery ([place]) 05/28/15   with DES to LAD and return to cath lab for restenosis requiring PTCA    Family History: Family History  Problem Relation Age of Onset  . Heart  disease Father   . Heart attack Brother 43  . Breast cancer Neg Hx     Social History   Socioeconomic History  . Marital status: Married    Spouse name: Not on file  . Number of children: Not on file  . Years of education: Not on file  . Highest education level: Not on file  Occupational History  . Not on file  Tobacco Use  . Smoking status: Former Research scientist (life sciences)  . Smokeless tobacco: Never Used  . Tobacco comment: about 40 years ago reported on 03/06/2019  Substance and Sexual Activity  . Alcohol use: No  . Drug use: No  . Sexual activity: Yes    Birth control/protection: Post-menopausal  Other Topics Concern  . Not on file  Social History Narrative  . Not on file   Social Determinants of Health   Financial Resource Strain:   . Difficulty of Paying Living Expenses: Not on file  Food Insecurity:   . Worried About Charity fundraiser in the Last Year: Not on file  . Ran Out of Food in the Last Year: Not on file  Transportation Needs:   . Lack of Transportation (Medical): Not on file  . Lack of Transportation (Non-Medical): Not on file  Physical Activity:   . Days of Exercise per Week: Not on file  . Minutes of Exercise per Session: Not on file  Stress:   . Feeling of Stress : Not on file  Social Connections:   . Frequency of Communication with Friends and Family: Not on file  . Frequency of Social Gatherings with Friends and Family: Not on file  . Attends Religious Services: Not on file  . Active Member of Clubs or Organizations: Not on file  . Attends Archivist Meetings: Not on file  . Marital Status: Not on file  Intimate Partner Violence:   . Fear of Current or Ex-Partner: Not on file  . Emotionally Abused: Not on file  . Physically Abused: Not on file  . Sexually Abused: Not on file      Review of Systems  Constitutional: Positive for fever.  HENT: Positive for postnasal drip, sinus pressure and sinus pain.   Respiratory: Positive for cough.    Cardiovascular: Negative for chest pain and leg swelling.  Gastrointestinal: Negative.     Vital Signs: Temp 100.2 F (37.9 C)   Ht 5\' 2"  (1.575 m)   Wt 172 lb (78 kg)   BMI 31.46 kg/m    Observation/Objective: Pt is congested vocally , fever at home    Assessment/Plan: 1. Fever in other diseases Monitor closely at home. Take Tylenol prn    2. Acute non-recurrent maxillary sinusitis Start Azithromycin, continue Flonase   General Counseling: Brenda Guerrero understanding of the findings of today's phone visit and agrees with plan of treatment. I have discussed any further diagnostic evaluation that may be needed or ordered today. We also reviewed her medications today. she has been encouraged to call the office with any questions or concerns that should  arise related to todays visit.   Meds ordered this encounter  Medications  . azithromycin (ZITHROMAX) 250 MG tablet    Sig: Use as directed    Dispense:  6 tablet    Refill:  0    Time spent:15 Minutes    Dr Lavera Guise Internal medicine

## 2020-02-27 ENCOUNTER — Other Ambulatory Visit: Payer: Self-pay

## 2020-02-27 DIAGNOSIS — E119 Type 2 diabetes mellitus without complications: Secondary | ICD-10-CM

## 2020-02-27 MED ORDER — DAPAGLIFLOZIN PROPANEDIOL 5 MG PO TABS: ORAL_TABLET | ORAL | 1 refills | Status: DC

## 2020-03-03 ENCOUNTER — Other Ambulatory Visit: Payer: Self-pay | Admitting: Nurse Practitioner

## 2020-03-05 LAB — COMPREHENSIVE METABOLIC PANEL
ALT: 24 IU/L (ref 0–32)
AST: 20 IU/L (ref 0–40)
Albumin/Globulin Ratio: 1.8 (ref 1.2–2.2)
Albumin: 4.3 g/dL (ref 3.8–4.8)
Alkaline Phosphatase: 114 IU/L (ref 44–121)
BUN/Creatinine Ratio: 19 (ref 12–28)
BUN: 15 mg/dL (ref 8–27)
Bilirubin Total: 0.2 mg/dL (ref 0.0–1.2)
CO2: 20 mmol/L (ref 20–29)
Calcium: 8.8 mg/dL (ref 8.7–10.3)
Chloride: 106 mmol/L (ref 96–106)
Creatinine, Ser: 0.77 mg/dL (ref 0.57–1.00)
GFR calc Af Amer: 96 mL/min/{1.73_m2} (ref >59)
GFR calc non Af Amer: 83 mL/min/{1.73_m2} (ref >59)
Globulin, Total: 2.4 g/dL (ref 1.5–4.5)
Glucose: 117 mg/dL — ABNORMAL HIGH (ref 65–99)
Potassium: 4.4 mmol/L (ref 3.5–5.2)
Sodium: 142 mmol/L (ref 134–144)
Total Protein: 6.7 g/dL (ref 6.0–8.5)

## 2020-03-05 LAB — VITAMIN D 25 HYDROXY (VIT D DEFICIENCY, FRACTURES): Vit D, 25-Hydroxy: 49.2 ng/mL (ref 30.0–100.0)

## 2020-03-05 LAB — CBC
Hematocrit: 43 % (ref 34.0–46.6)
Hemoglobin: 14 g/dL (ref 11.1–15.9)
MCH: 29.9 pg (ref 26.6–33.0)
MCHC: 32.6 g/dL (ref 31.5–35.7)
MCV: 92 fL (ref 79–97)
Platelets: 227 10*3/uL (ref 150–450)
RBC: 4.68 x10E6/uL (ref 3.77–5.28)
RDW: 13 % (ref 11.7–15.4)
WBC: 5.9 10*3/uL (ref 3.4–10.8)

## 2020-03-05 LAB — LIPID PANEL WITH LDL/HDL RATIO
Cholesterol, Total: 108 mg/dL (ref 100–199)
HDL: 26 mg/dL — ABNORMAL LOW (ref >39)
LDL Chol Calc (NIH): 52 mg/dL (ref 0–99)
LDL/HDL Ratio: 2 ratio (ref 0.0–3.2)
Triglycerides: 177 mg/dL — ABNORMAL HIGH (ref 0–149)
VLDL Cholesterol Cal: 30 mg/dL (ref 5–40)

## 2020-03-05 LAB — HCV INTERPRETATION

## 2020-03-05 LAB — T4, FREE: Free T4: 1.08 ng/dL (ref 0.82–1.77)

## 2020-03-05 LAB — HCV AB W REFLEX TO QUANT PCR: HCV Ab: <0.1 s/co ratio (ref 0.0–0.9)

## 2020-03-05 LAB — TSH: TSH: 1.99 u[IU]/mL (ref 0.450–4.500)

## 2020-03-05 NOTE — Progress Notes (Signed)
Discuss with patient at visit 11/22/021

## 2020-03-08 ENCOUNTER — Ambulatory Visit (INDEPENDENT_AMBULATORY_CARE_PROVIDER_SITE_OTHER): Payer: Commercial Managed Care - PPO | Admitting: Nurse Practitioner

## 2020-03-08 ENCOUNTER — Encounter: Payer: Self-pay | Admitting: Nurse Practitioner

## 2020-03-08 ENCOUNTER — Other Ambulatory Visit: Payer: Self-pay

## 2020-03-08 VITALS — BP 138/86 | HR 82 | Temp 98.0°F | Resp 16 | Ht 62.0 in | Wt 170.6 lb

## 2020-03-08 DIAGNOSIS — R300 Dysuria: Secondary | ICD-10-CM

## 2020-03-08 DIAGNOSIS — E1165 Type 2 diabetes mellitus with hyperglycemia: Secondary | ICD-10-CM | POA: Diagnosis not present

## 2020-03-08 DIAGNOSIS — Z0001 Encounter for general adult medical examination with abnormal findings: Secondary | ICD-10-CM | POA: Diagnosis not present

## 2020-03-08 DIAGNOSIS — I2510 Atherosclerotic heart disease of native coronary artery without angina pectoris: Secondary | ICD-10-CM | POA: Diagnosis not present

## 2020-03-08 DIAGNOSIS — I10 Essential (primary) hypertension: Secondary | ICD-10-CM | POA: Diagnosis not present

## 2020-03-08 LAB — POCT GLYCOSYLATED HEMOGLOBIN (HGB A1C): Hemoglobin A1C: 6.5 % — AB (ref 4.0–5.6)

## 2020-03-08 NOTE — Progress Notes (Signed)
[facility] [place], [place] [address]  Internal MEDICINE  Office Visit Note  Patient Name: Brenda Guerrero  [id number]  [id number]  Date of Service: 04/03/2020   Pt is here for routine health maintenance examination   Chief Complaint  Patient presents with  . Annual Exam  . Diabetes  . Hyperlipidemia  . Hypertension  . Quality Metric Gaps    flu  . controlled substance form    reviewed with PT     The patient is here for health maintenance exam. She had routine, fasting labs done prior to this visit. Her triglycerides were 177 with the remainder of her lipid panel normal. Other labs were within normal. Her HgbA1c is 6.5 today. She does have diet controlled type 2 diabetes. Has had her HgbA1c at this level in the past. Has been able to bring it down to 6.2 by eating a prudent diet and increasing her regular exercise. 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  She did have mammogram in July with ultrasound for further evaluation of cysts in right breast. Results were benign.  The patient did get her flu shot in October at her place of work. She also had her [place] 19 booster last Thursday.    Current Medication: Outpatient Encounter Medications as of 03/08/2020  Medication Sig  . alendronate (FOSAMAX) 70 MG tablet TAKE 1 TABLET BY MOUTH ONCE WEEKLY FOR OSTEOPENIA  . aspirin 81 MG chewable tablet Chew 1 tablet (81 mg total) by mouth daily.  . calcium carbonate (OS-CAL) 1250 (500 Ca) MG chewable tablet Chew 1 tablet by mouth daily.  . carvedilol (COREG) 3.125 MG tablet TAKE 1 TABLET BY MOUTH 2 (TWO) TIMES DAILY WITH A MEAL.  . cholecalciferol (VITAMIN D) 1000 units tablet Take 1,000 Units by mouth daily.  . dapagliflozin propanediol (FARXIGA) 5 MG TABS tablet TAKE 1 TAB BY MOUTH DAILY BEFORE BREAKFAST.  . fluticasone (FLONASE) 50 MCG/ACT nasal spray Place 1 spray into  both nostrils daily.  Marland Kitchen ibuprofen (ADVIL,MOTRIN) 800 MG tablet Take 800 mg by mouth every 6 (six) hours as needed.   . Multiple Vitamin (MULTI-VITAMINS) TABS Take by mouth.  . nitroGLYCERIN (NITROSTAT) 0.4 MG SL tablet Place 1 tablet (0.4 mg total) under the tongue every 5 (five) minutes as needed for chest pain.  . Omega-3 Fatty Acids (FISH OIL) 1000 MG CPDR Take 2,000 mg by mouth daily.  Marland Kitchen triamcinolone (KENALOG) 0.025 % cream Apply 1 application topically 2 (two) times daily.  . [DISCONTINUED] azithromycin (ZITHROMAX) 250 MG tablet Use as directed  . rosuvastatin (CRESTOR) 40 MG tablet Take 1 tablet (40 mg total) by mouth daily.  . [DISCONTINUED] FARXIGA 5 MG TABS tablet TAKE 1 TAB BY MOUTH DAILY BEFORE BREAKFAST.   No facility-administered encounter medications on file as of 03/08/2020.    Surgical History: Past Surgical History:  Procedure Laterality Date  . CARDIAC CATHETERIZATION N/A 05/28/2015   Procedure: Left Heart Cath and Coronary Angiography;  Surgeon: Peter M Martinique, MD;  Location: [place] [facility];  Service: Cardiovascular;  Laterality: N/A;  . CARDIAC CATHETERIZATION N/A 05/28/2015   Procedure: Coronary Stent Intervention;  Surgeon: Peter M Martinique, MD;  Location: [place] [facility];  Service: Cardiovascular;  Laterality: N/A;  . CARDIAC CATHETERIZATION N/A 05/28/2015   Procedure: Coronary Stent Intervention;  Surgeon: Peter M Martinique, MD;  Location: [place] [facility];  Service:  Cardiovascular;  Laterality: N/A;  . CARDIAC CATHETERIZATION N/A 05/28/2015   Procedure: Coronary/Graft Angiography;  Surgeon: Peter M Martinique, MD;  Location: [place] [facility];  Service: Cardiovascular;  Laterality: N/A;  . CARDIAC CATHETERIZATION N/A 05/28/2015   Procedure: Intravascular Ultrasound/IVUS;  Surgeon: Peter M Martinique, MD;  Location: [place] [facility];  Service: Cardiovascular;  Laterality: N/A;  . TUBAL LIGATION      Medical History: Past Medical History:  Diagnosis Date  . Asthma    . CAD in native artery   . Diabetes mellitus without complication ([place])   . Hyperlipidemia LDL goal <70   . Hypertension   . ST elevation (STEMI) myocardial infarction involving left anterior descending coronary artery ([place]) 05/28/15   with DES to LAD and return to cath lab for restenosis requiring PTCA    Family History: Family History  Problem Relation Age of Onset  . Heart disease Father   . Heart attack Brother 66  . Breast cancer Neg Hx       Review of Systems  Constitutional: Negative for chills, fatigue and unexpected weight change.  HENT: Positive for postnasal drip. Negative for congestion, rhinorrhea, sneezing and sore throat.   Respiratory: Negative for cough, chest tightness and shortness of breath.   Cardiovascular: Negative for chest pain and palpitations.  Gastrointestinal: Negative for abdominal pain, constipation, diarrhea, nausea and vomiting.  Endocrine: Negative for cold intolerance, heat intolerance, polydipsia and polyuria.  Genitourinary: Negative for dysuria, frequency, hematuria and urgency.  Musculoskeletal: Negative for arthralgias, back pain, joint swelling and neck pain.  Skin: Negative for rash.  Allergic/Immunologic: Negative for environmental allergies.  Neurological: Negative for dizziness, tremors, numbness and headaches.  Hematological: Negative for adenopathy. Does not bruise/bleed easily.  Psychiatric/Behavioral: Negative for behavioral problems (Depression), sleep disturbance and suicidal ideas. The patient is not nervous/anxious.      Today's Vitals   03/08/20 1520  BP: 138/86  Pulse: 82  Resp: 16  Temp: 98 F (36.7 C)  SpO2: 95%  Weight: 170 lb 9.6 oz (77.4 kg)  Height: _0  (1.575 m)   Body mass index is 31.2 kg/m.  Physical Exam Vitals and nursing note reviewed.  Constitutional:      General: She is not in acute distress.    Appearance: Normal appearance. She is well-developed and well-nourished. She is not diaphoretic.   HENT:     Head: Normocephalic and atraumatic.     Nose: Nose normal.     Mouth/Throat:     Mouth: Oropharynx is clear and moist.     Pharynx: No oropharyngeal exudate.  Eyes:     Extraocular Movements: EOM normal.     Pupils: Pupils are equal, round, and reactive to light.  Neck:     Thyroid: No thyromegaly.     Vascular: No carotid bruit or JVD.     Trachea: No tracheal deviation.  Cardiovascular:     Rate and Rhythm: Normal rate and regular rhythm.     Pulses:          Dorsalis pedis pulses are 1+ on the right side and 1+ on the left side.       Posterior tibial pulses are 1+ on the right side and 1+ on the left side.     Heart sounds: Normal heart sounds. No murmur heard. No friction rub. No gallop.   Pulmonary:     Effort: Pulmonary effort is normal. No respiratory distress.     Breath sounds: Normal breath sounds. No wheezing  or rales.  Chest:     Chest wall: No tenderness.  Breasts:     Right: No swelling, bleeding, inverted nipple, mass, nipple discharge, skin change, tenderness or axillary adenopathy.     Left: Normal. No swelling, bleeding, inverted nipple, mass, nipple discharge, skin change, tenderness or axillary adenopathy.    Abdominal:     General: Bowel sounds are normal.     Palpations: Abdomen is soft.     Tenderness: There is no abdominal tenderness.  Musculoskeletal:        General: Normal range of motion.     Cervical back: Normal range of motion and neck supple.     Right foot: Normal range of motion. No deformity or bunion.     Left foot: Normal range of motion. No deformity or bunion.  Feet:     Right foot:     Protective Sensation: 10 sites tested. 10 sites sensed.     Skin integrity: Skin integrity normal.     Toenail Condition: Right toenails are normal.     Left foot:     Protective Sensation: 10 sites tested. 10 sites sensed.     Skin integrity: Skin integrity normal.     Toenail Condition: Left toenails are normal.  Lymphadenopathy:      Cervical: No cervical adenopathy.     Upper Body:     Right upper body: No axillary adenopathy.     Left upper body: No axillary adenopathy.  Skin:    General: Skin is warm and dry.     Capillary Refill: Capillary refill takes 2 to 3 seconds.  Neurological:     General: No focal deficit present.     Mental Status: She is alert and oriented to person, place, and time.     Cranial Nerves: No cranial nerve deficit.  Psychiatric:        Mood and Affect: Mood and affect and mood normal.        Behavior: Behavior normal.        Thought Content: Thought content normal.        Judgment: Judgment normal.      LABS: Recent Results (from the past 2160 hour(s))  Comprehensive metabolic panel     Status: Abnormal   Collection Time: 03/03/20  8:15 AM  Result Value Ref Range   Glucose 117 (H) 65 - 99 mg/dL   BUN 15 8 - 27 mg/dL   Creatinine, Ser 0.77 0.57 - 1.00 mg/dL   GFR calc non Af Amer 83 >59 mL/min/1.73   GFR calc Af Amer 96 >59 mL/min/1.73    Comment: **In accordance with recommendations from the NKF-ASN Task force,**   [facility] is in the process of updating its eGFR calculation to the   2021 CKD-EPI creatinine equation that estimates kidney function   without a race variable.    BUN/Creatinine Ratio 19 12 - 28   Sodium 142 134 - 144 mmol/L   Potassium 4.4 3.5 - 5.2 mmol/L   Chloride 106 96 - 106 mmol/L   CO2 20 20 - 29 mmol/L   Calcium 8.8 8.7 - 10.3 mg/dL   Total Protein 6.7 6.0 - 8.5 g/dL   Albumin 4.3 3.8 - 4.8 g/dL   Globulin, Total 2.4 1.5 - 4.5 g/dL   Albumin/Globulin Ratio 1.8 1.2 - 2.2   Bilirubin Total 0.2 0.0 - 1.2 mg/dL   Alkaline Phosphatase 114 44 - 121 IU/L    Comment:               **  Please note reference interval change**   AST 20 0 - 40 IU/L   ALT 24 0 - 32 IU/L  CBC     Status: None   Collection Time: 03/03/20  8:15 AM  Result Value Ref Range   WBC 5.9 3.4 - 10.8 x10E3/uL   RBC 4.68 3.77 - 5.28 x10E6/uL   Hemoglobin 14.0 11.1 - 15.9 g/dL   Hematocrit  43.0 34.0 - 46.6 %   MCV 92 79 - 97 fL   MCH 29.9 26.6 - 33.0 pg   MCHC 32.6 31.5 - 35.7 g/dL   RDW 13.0 11.7 - 15.4 %   Platelets 227 150 - 450 x10E3/uL  Lipid Panel With LDL/HDL Ratio     Status: Abnormal   Collection Time: 03/03/20  8:15 AM  Result Value Ref Range   Cholesterol, Total 108 100 - 199 mg/dL   Triglycerides 177 (H) 0 - 149 mg/dL   HDL 26 (L) >39 mg/dL   VLDL Cholesterol Cal 30 5 - 40 mg/dL   LDL Chol Calc (NIH) 52 0 - 99 mg/dL   LDL/HDL Ratio 2.0 0.0 - 3.2 ratio    Comment:                                     LDL/HDL Ratio                                             Men  Women                               1/2 Avg.Risk  1.0    1.5                                   Avg.Risk  3.6    3.2                                2X Avg.Risk  6.2    5.0                                3X Avg.Risk  8.0    6.1   HCV Ab w Reflex to Quant PCR     Status: None   Collection Time: 03/03/20  8:15 AM  Result Value Ref Range   HCV Ab <0.1 0.0 - 0.9 s/co ratio  T4, free     Status: None   Collection Time: 03/03/20  8:15 AM  Result Value Ref Range   Free T4 1.08 0.82 - 1.77 ng/dL  TSH     Status: None   Collection Time: 03/03/20  8:15 AM  Result Value Ref Range   TSH 1.990 0.450 - 4.500 uIU/mL  VITAMIN D 25 Hydroxy (Vit-D Deficiency, Fractures)     Status: None   Collection Time: 03/03/20  8:15 AM  Result Value Ref Range   Vit D, 25-Hydroxy 49.2 30.0 - 100.0 ng/mL    Comment: Vitamin D deficiency has been defined by the [place] practice guideline as a level  of serum 25-OH vitamin D less than 20 ng/mL (1,2). The Endocrine Society went on to further define vitamin D insufficiency as a level between 21 and 29 ng/mL (2). 1. IOM (Institute of Medicine). 2010. Dietary reference    intakes for calcium and D. [place]: The    [employer]. 2. Holick MF, Binkley Eminence, Bischoff-Ferrari HA, et al.    Evaluation, treatment, and prevention  of vitamin D    deficiency: an Endocrine Society clinical practice    guideline. JCEM. 2011 Jul; 96(7):1911-30.   Interpretation:     Status: None   Collection Time: 03/03/20  8:15 AM  Result Value Ref Range   HCV Interp 1: Comment     Comment: Negative Not infected with HCV, unless recent infection is suspected or other evidence exists to indicate HCV infection.   UA/M w/rflx Culture, Routine     Status: None   Collection Time: 03/08/20  3:31 PM   Specimen: Urine   Urine  Result Value Ref Range   Specific Gravity, UA 1.011 1.005 - 1.030   pH, UA 5.0 5.0 - 7.5   Color, UA Yellow Yellow   Appearance Ur Clear Clear   Leukocytes,UA Negative Negative   Protein,UA Negative Negative/Trace   Glucose, UA Negative Negative   Ketones, UA Negative Negative   RBC, UA Negative Negative   Bilirubin, UA Negative Negative   Urobilinogen, Ur 0.2 0.2 - 1.0 mg/dL   Nitrite, UA Negative Negative   Microscopic Examination Comment     Comment: Microscopic follows if indicated.   Microscopic Examination See below:     Comment: Microscopic was indicated and was performed.   Urinalysis Reflex Comment     Comment: This specimen will not reflex to a Urine Culture.  Microscopic Examination     Status: None   Collection Time: 03/08/20  3:31 PM   Urine  Result Value Ref Range   WBC, UA None seen 0 - 5 /hpf   RBC 0-2 0 - 2 /hpf   Epithelial Cells (non renal) None seen 0 - 10 /hpf   Casts None seen None seen /lpf   Bacteria, UA None seen None seen/Few  POCT HgB A1C     Status: Abnormal   Collection Time: 03/08/20  3:38 PM  Result Value Ref Range   Hemoglobin A1C 6.5 (A) 4.0 - 5.6 %   HbA1c POC (<> result, manual entry)     HbA1c, POC (prediabetic range)     HbA1c, POC (controlled diabetic range)      .Assessment/Plan: 1. Encounter for general adult medical examination with abnormal findings Annual health maintenance exam today.   2. Type 2 diabetes mellitus with hyperglycemia, without  long-term current use of insulin (HCC) - POCT HgB A1C 6.5 today. Continue farxiga 70m daily. Monitor blood sugars closely.   3. Essential hypertension Stable. Continue bp medication as prescribed.   4. Coronary artery disease involving native coronary artery of native heart without angina pectoris Continue regular visits with cardiology as scheduled.   5. Dysuria - UA/M w/rflx Culture, Routine  General Counseling: TJakylahverbalizes understanding of the findings of todays visit and agrees with plan of treatment. I have discussed any further diagnostic evaluation that may be needed or ordered today. We also reviewed her medications today. she has been encouraged to call the office with any questions or concerns that should arise related to todays visit.    Counseling:  Diabetes Counseling:  1. Addition of ACE inh/ ARB'S for  nephroprotection. Microalbumin is updated  2. Diabetic foot care, prevention of complications. Podiatry consult 3. Exercise and lose weight.  4. Diabetic eye examination, Diabetic eye exam is updated  5. Monitor blood sugar closlely. nutrition counseling.  6. Sign and symptoms of hypoglycemia including shaking sweating,confusion and headaches.  This patient was seen by Leretha Pol FNP Collaboration with Dr Lavera Guise as a part of collaborative care agreement  Orders Placed This Encounter  Procedures  . Microscopic Examination  . UA/M w/rflx Culture, Routine  . POCT HgB A1C    Total time spent: 45 Minutes  Time spent includes review of chart, medications, test results, and follow up plan with the patient.     Lavera Guise, MD  Internal Medicine

## 2020-03-09 LAB — UA/M W/RFLX CULTURE, ROUTINE
Bilirubin, UA: NEGATIVE
Glucose, UA: NEGATIVE
Ketones, UA: NEGATIVE
Leukocytes,UA: NEGATIVE
Nitrite, UA: NEGATIVE
Protein,UA: NEGATIVE
RBC, UA: NEGATIVE
Specific Gravity, UA: 1.011 (ref 1.005–1.030)
Urobilinogen, Ur: 0.2 mg/dL (ref 0.2–1.0)
pH, UA: 5 (ref 5.0–7.5)

## 2020-03-09 LAB — MICROSCOPIC EXAMINATION
Bacteria, UA: NONE SEEN
Casts: NONE SEEN /lpf
Epithelial Cells (non renal): NONE SEEN /hpf (ref 0–10)
WBC, UA: NONE SEEN /hpf (ref 0–5)

## 2020-04-21 ENCOUNTER — Other Ambulatory Visit: Payer: Self-pay

## 2020-04-21 DIAGNOSIS — M810 Age-related osteoporosis without current pathological fracture: Secondary | ICD-10-CM

## 2020-04-21 MED ORDER — ALENDRONATE SODIUM 70 MG PO TABS: ORAL_TABLET | ORAL | 4 refills | Status: AC

## 2020-04-26 ENCOUNTER — Ambulatory Visit: Payer: Commercial Managed Care - PPO | Admitting: Cardiovascular Disease

## 2020-04-26 ENCOUNTER — Encounter: Payer: Self-pay | Admitting: Internal Medicine

## 2020-04-26 ENCOUNTER — Ambulatory Visit: Payer: Commercial Managed Care - PPO | Admitting: Internal Medicine

## 2020-04-26 VITALS — Resp 16 | Ht 62.5 in | Wt 172.0 lb

## 2020-04-26 DIAGNOSIS — Z20822 Contact with and (suspected) exposure to covid-19: Secondary | ICD-10-CM | POA: Diagnosis not present

## 2020-04-26 DIAGNOSIS — J0100 Acute maxillary sinusitis, unspecified: Secondary | ICD-10-CM | POA: Diagnosis not present

## 2020-04-26 MED ORDER — LEVOFLOXACIN 500 MG PO TABS: 500.0000 mg | ORAL_TABLET | Freq: Every day | ORAL | 0 refills | Status: DC

## 2020-04-26 NOTE — Progress Notes (Signed)
[facility] [place], [place] [address]  Internal MEDICINE  Telephone Visit  Patient Name: Brenda Guerrero  [id number]  [id number]  Date of Service: 04/26/2020  I connected with the patient at 10:18 by telephone and verified the patients identity using two identifiers.   I discussed the limitations, risks, security and privacy concerns of performing an evaluation and management service by telephone and the availability of in person appointments. I also discussed with the patient that there may be a patient responsible charge related to the service.  The patient expressed understanding and agrees to proceed.    Chief Complaint  Patient presents with  . Acute Visit    Started yesterday, low grade fever, being tested for COVID tomorrow, fully vaccinated with booster   . Sinusitis    Drainage  . Cough    Horse, sore throat,   . Telephone Assessment    Phone call  . Telephone Screen    [phone]    HPI  Patient woke up at 3:30am with sinus drainage and hoarseness. Went to bed with a sinus headache. Temp 100F this morning. Unsure if Covid exposure. Coworkers had Covid, but she had no close contact with them.  She has been vaccinated, and received her booster in October 2021. She has never had Covid.  Patient also has nasal discharge and sore throat. Going for COVID test today. Denies any shortness of breath or other acute changes.   Current Medication: Outpatient Encounter Medications as of 04/26/2020  Medication Sig  . levofloxacin (LEVAQUIN) 500 MG tablet Take 1 tablet (500 mg total) by mouth daily.  Marland Kitchen alendronate (FOSAMAX) 70 MG tablet TAKE 1 TABLET BY MOUTH ONCE WEEKLY FOR OSTEOPENIA  . aspirin 81 MG chewable tablet Chew 1 tablet (81 mg total) by mouth daily.  . calcium carbonate (OS-CAL) 1250 (500 Ca) MG chewable tablet Chew 1 tablet by mouth daily.  . carvedilol (COREG) 3.125 MG tablet TAKE 1 TABLET BY MOUTH 2 (TWO) TIMES DAILY WITH A MEAL.  .  cholecalciferol (VITAMIN D) 1000 units tablet Take 1,000 Units by mouth daily.  . dapagliflozin propanediol (FARXIGA) 5 MG TABS tablet TAKE 1 TAB BY MOUTH DAILY BEFORE BREAKFAST.  . fluticasone (FLONASE) 50 MCG/ACT nasal spray Place 1 spray into both nostrils daily.  Marland Kitchen ibuprofen (ADVIL,MOTRIN) 800 MG tablet Take 800 mg by mouth every 6 (six) hours as needed.   . Multiple Vitamin (MULTI-VITAMINS) TABS Take by mouth.  . nitroGLYCERIN (NITROSTAT) 0.4 MG SL tablet Place 1 tablet (0.4 mg total) under the tongue every 5 (five) minutes as needed for chest pain.  . Omega-3 Fatty Acids (FISH OIL) 1000 MG CPDR Take 2,000 mg by mouth daily.  . rosuvastatin (CRESTOR) 40 MG tablet Take 1 tablet (40 mg total) by mouth daily.  Marland Kitchen triamcinolone (KENALOG) 0.025 % cream Apply 1 application topically 2 (two) times daily.   No facility-administered encounter medications on file as of 04/26/2020.    Surgical History: Past Surgical History:  Procedure Laterality Date  . CARDIAC CATHETERIZATION N/A 05/28/2015   Procedure: Left Heart Cath and Coronary Angiography;  Surgeon: Peter M Martinique, MD;  Location: [place] [facility];  Service: Cardiovascular;  Laterality: N/A;  . CARDIAC CATHETERIZATION N/A 05/28/2015   Procedure: Coronary Stent Intervention;  Surgeon: Peter M Martinique, MD;  Location: [place] [facility];  Service: Cardiovascular;  Laterality: N/A;  . CARDIAC CATHETERIZATION N/A 05/28/2015   Procedure: Coronary Stent Intervention;  Surgeon: Peter M Martinique, MD;  Location:  [place] [facility];  Service: Cardiovascular;  Laterality: N/A;  . CARDIAC CATHETERIZATION N/A 05/28/2015   Procedure: Coronary/Graft Angiography;  Surgeon: Peter M Martinique, MD;  Location: [place] [facility];  Service: Cardiovascular;  Laterality: N/A;  . CARDIAC CATHETERIZATION N/A 05/28/2015   Procedure: Intravascular Ultrasound/IVUS;  Surgeon: Peter M Martinique, MD;  Location: [place] [facility];  Service: Cardiovascular;  Laterality: N/A;  . TUBAL  LIGATION      Medical History: Past Medical History:  Diagnosis Date  . Asthma   . CAD in native artery   . Diabetes mellitus without complication ([place])   . Hyperlipidemia LDL goal <70   . Hypertension   . ST elevation (STEMI) myocardial infarction involving left anterior descending coronary artery ([place]) 05/28/15   with DES to LAD and return to cath lab for restenosis requiring PTCA    Family History: Family History  Problem Relation Age of Onset  . Heart disease Father   . Heart attack Brother 109  . Breast cancer Neg Hx     Social History   Socioeconomic History  . Marital status: Married    Spouse name: Not on file  . Number of children: Not on file  . Years of education: Not on file  . Highest education level: Not on file  Occupational History  . Not on file  Tobacco Use  . Smoking status: Former Research scientist (life sciences)  . Smokeless tobacco: Never Used  . Tobacco comment: about 40 years ago reported on 03/06/2019  Substance and Sexual Activity  . Alcohol use: No  . Drug use: No  . Sexual activity: Yes    Birth control/protection: Post-menopausal  Other Topics Concern  . Not on file  Social History Narrative  . Not on file   Social Determinants of Health   Financial Resource Strain: Not on file  Food Insecurity: Not on file  Transportation Needs: Not on file  Physical Activity: Not on file  Stress: Not on file  Social Connections: Not on file  Intimate Partner Violence: Not on file      Review of Systems  Constitutional: Positive for fatigue and fever. Negative for chills.  HENT: Positive for congestion, postnasal drip, sinus pressure, sinus pain, sore throat and voice change. Negative for ear pain.   Respiratory: Positive for cough. Negative for shortness of breath and wheezing.   Cardiovascular: Negative for chest pain.  Skin: Negative for color change.  Allergic/Immunologic: Negative for environmental allergies and food allergies.  Neurological: Negative for  headaches.  Psychiatric/Behavioral: Behavioral problem: depression.    Vital Signs: Resp 16   Ht 5' 2.5" (1.588 m)   Wt 172 lb (78 kg)   BMI 30.96 kg/m    Observation/Objective:  Patient sounds nasally congested and looks fatigued, but able to carry out conversation. In no acute distress.    Assessment/Plan: 1. Acute non-recurrent maxillary sinusitis Patient should continue Flonase, sinus rinse, and take motrin as needed for fever. Started on the following medication. - levofloxacin (LEVAQUIN) 500 MG tablet; Take 1 tablet (500 mg total) by mouth daily.  Dispense: 7 tablet; Refill: 0  2. Close exposure to COVID-19 virus Patient will obtain COVID test and inform the office. We will then provide further recommendation for treatment.  General Counseling: zylphia sergio understanding of the findings of today's phone visit and agrees with plan of treatment. I have discussed any further diagnostic evaluation that may be needed or ordered today. We also reviewed her medications today. she has been encouraged to  call the office with any questions or concerns that should arise related to todays visit. Patient was instructed to rest and drink fluids, monitor blood sugars and BP at home.     Meds ordered this encounter  Medications  . levofloxacin (LEVAQUIN) 500 MG tablet    Sig: Take 1 tablet (500 mg total) by mouth daily.    Dispense:  7 tablet    Refill:  0    Time spent: 67 Minutes    Dr Lavera Guise Internal medicine

## 2020-04-27 MED ORDER — ACETAMINOPHEN-CODEINE #3 300-30 MG PO TABS: 1.0000 | ORAL_TABLET | Freq: Four times a day (QID) | ORAL | 0 refills | Status: AC | PRN

## 2020-04-27 NOTE — Addendum Note (Signed)
Addended by: Lavera Guise on: 04/27/2020 03:59 PM   Modules accepted: Orders

## 2020-04-28 ENCOUNTER — Encounter: Payer: Self-pay | Admitting: Hospice and Palliative Medicine

## 2020-04-28 ENCOUNTER — Other Ambulatory Visit: Payer: Self-pay | Admitting: Cardiovascular Disease

## 2020-04-28 ENCOUNTER — Other Ambulatory Visit: Payer: Self-pay

## 2020-04-28 ENCOUNTER — Ambulatory Visit: Payer: Commercial Managed Care - PPO | Admitting: Internal Medicine

## 2020-04-28 VITALS — BP 152/92 | HR 102 | Temp 97.5°F | Resp 16 | Ht 62.5 in | Wt 172.4 lb

## 2020-04-28 DIAGNOSIS — J029 Acute pharyngitis, unspecified: Secondary | ICD-10-CM

## 2020-04-28 DIAGNOSIS — R6889 Other general symptoms and signs: Secondary | ICD-10-CM

## 2020-04-28 DIAGNOSIS — I10 Essential (primary) hypertension: Secondary | ICD-10-CM

## 2020-04-28 LAB — POCT INFLUENZA A/B
Influenza A, POC: NEGATIVE
Influenza B, POC: NEGATIVE

## 2020-04-28 LAB — POCT RAPID STREP A (OFFICE): Rapid Strep A Screen: NEGATIVE

## 2020-04-28 MED ORDER — LIDOCAINE VISCOUS HCL 2 % MT SOLN: OROMUCOSAL | 0 refills | Status: DC

## 2020-04-28 MED ORDER — AMOXICILLIN-POT CLAVULANATE 875-125 MG PO TABS: 1.0000 | ORAL_TABLET | Freq: Two times a day (BID) | ORAL | 0 refills | Status: DC

## 2020-04-28 NOTE — Progress Notes (Signed)
[facility] [place], [place] [address]  Internal MEDICINE  Office Visit Note  Patient Name: Brenda Guerrero  [id number]  [id number]  Date of Service: 04/28/2020  Chief Complaint  Patient presents with  . Sinusitis  . Other  . Acute Visit    HPI  Pt is here for acute and sick visit, she has been sick for last 3 days, she was started on Levaquin for acute sinusitis but now she has worse gotten sore throat then before. There has been a covid exposure at work. Denies any fever or chills  Current Medication: Outpatient Encounter Medications as of 04/28/2020  Medication Sig  . acetaminophen-codeine (TYLENOL #3) 300-30 MG tablet Take 1 tablet by mouth every 6 (six) hours as needed for up to 3 days for moderate pain.  Marland Kitchen alendronate (FOSAMAX) 70 MG tablet TAKE 1 TABLET BY MOUTH ONCE WEEKLY FOR OSTEOPENIA  . aspirin 81 MG chewable tablet Chew 1 tablet (81 mg total) by mouth daily.  . calcium carbonate (OS-CAL) 1250 (500 Ca) MG chewable tablet Chew 1 tablet by mouth daily.  . carvedilol (COREG) 3.125 MG tablet TAKE 1 TABLET BY MOUTH 2 (TWO) TIMES DAILY WITH A MEAL.  . cholecalciferol (VITAMIN D) 1000 units tablet Take 1,000 Units by mouth daily.  . dapagliflozin propanediol (FARXIGA) 5 MG TABS tablet TAKE 1 TAB BY MOUTH DAILY BEFORE BREAKFAST.  . fluticasone (FLONASE) 50 MCG/ACT nasal spray Place 1 spray into both nostrils daily.  Marland Kitchen ibuprofen (ADVIL,MOTRIN) 800 MG tablet Take 800 mg by mouth every 6 (six) hours as needed.   Marland Kitchen levofloxacin (LEVAQUIN) 500 MG tablet Take 1 tablet (500 mg total) by mouth daily.  . Multiple Vitamin (MULTI-VITAMINS) TABS Take by mouth.  . nitroGLYCERIN (NITROSTAT) 0.4 MG SL tablet Place 1 tablet (0.4 mg total) under the tongue every 5 (five) minutes as needed for chest pain.  . Omega-3 Fatty Acids (FISH OIL) 1000 MG CPDR Take 2,000 mg by mouth daily.  . rosuvastatin (CRESTOR) 40 MG tablet TAKE 1 TABLET BY MOUTH EVERY DAY  .  triamcinolone (KENALOG) 0.025 % cream Apply 1 application topically 2 (two) times daily.   No facility-administered encounter medications on file as of 04/28/2020.    Surgical History: Past Surgical History:  Procedure Laterality Date  . CARDIAC CATHETERIZATION N/A 05/28/2015   Procedure: Left Heart Cath and Coronary Angiography;  Surgeon: Peter M Martinique, MD;  Location: [place] [facility];  Service: Cardiovascular;  Laterality: N/A;  . CARDIAC CATHETERIZATION N/A 05/28/2015   Procedure: Coronary Stent Intervention;  Surgeon: Peter M Martinique, MD;  Location: [place] [facility];  Service: Cardiovascular;  Laterality: N/A;  . CARDIAC CATHETERIZATION N/A 05/28/2015   Procedure: Coronary Stent Intervention;  Surgeon: Peter M Martinique, MD;  Location: [place] [facility];  Service: Cardiovascular;  Laterality: N/A;  . CARDIAC CATHETERIZATION N/A 05/28/2015   Procedure: Coronary/Graft Angiography;  Surgeon: Peter M Martinique, MD;  Location: [place] [facility];  Service: Cardiovascular;  Laterality: N/A;  . CARDIAC CATHETERIZATION N/A 05/28/2015   Procedure: Intravascular Ultrasound/IVUS;  Surgeon: Peter M Martinique, MD;  Location: [place] [facility];  Service: Cardiovascular;  Laterality: N/A;  . TUBAL LIGATION      Medical History: Past Medical History:  Diagnosis Date  . Asthma   . CAD in native artery   . Diabetes mellitus without complication ([place])   . Hyperlipidemia LDL goal <70   . Hypertension   . ST elevation (STEMI) myocardial infarction involving left  anterior descending coronary artery ([place]) 05/28/15   with DES to LAD and return to cath lab for restenosis requiring PTCA    Family History: Family History  Problem Relation Age of Onset  . Heart disease Father   . Heart attack Brother 93  . Breast cancer Neg Hx     Social History   Socioeconomic History  . Marital status: Married    Spouse name: Not on file  . Number of children: Not on file  . Years of education: Not on file  . Highest  education level: Not on file  Occupational History  . Not on file  Tobacco Use  . Smoking status: Former Research scientist (life sciences)  . Smokeless tobacco: Never Used  . Tobacco comment: about 40 years ago reported on 03/06/2019  Substance and Sexual Activity  . Alcohol use: No  . Drug use: No  . Sexual activity: Yes    Birth control/protection: Post-menopausal  Other Topics Concern  . Not on file  Social History Narrative  . Not on file   Social Determinants of Health   Financial Resource Strain: Not on file  Food Insecurity: Not on file  Transportation Needs: Not on file  Physical Activity: Not on file  Stress: Not on file  Social Connections: Not on file  Intimate Partner Violence: Not on file      Review of Systems  Constitutional: Negative for fatigue and fever.  HENT: Positive for sore throat. Negative for congestion, mouth sores and postnasal drip.   Respiratory: Negative.  Negative for cough.   Cardiovascular: Negative.  Negative for chest pain.  Genitourinary: Negative for flank pain.  Psychiatric/Behavioral: Negative.     Vital Signs: BP (!) 152/92   Pulse (!) 102   Temp (!) 97.5 F (36.4 C)   Resp 16   Ht 5' 2.5" (1.588 m)   Wt 172 lb 6.4 oz (78.2 kg)   SpO2 97%   BMI 31.03 kg/m    Physical Exam Constitutional:      Appearance: She is ill-appearing.  HENT:     Nose: Nose normal.     Mouth/Throat:     Pharynx: Oropharyngeal exudate present.  Eyes:     Extraocular Movements: Extraocular movements intact.     Pupils: Pupils are equal, round, and reactive to light.  Cardiovascular:     Rate and Rhythm: Normal rate and regular rhythm.     Pulses: Normal pulses.     Heart sounds: Normal heart sounds.  Neurological:     Mental Status: She is alert.        Assessment/Plan: 1. Sore throat Pt has acute sore throat ?? Peritonsillar abscess, change levaquin to augentin, monitor symptoms, might need to see ENT  - POCT rapid strep A - POCT Influenza A/B -  amoxicillin-clavulanate (AUGMENTIN) 875-125 MG tablet; Take 1 tablet by mouth 2 (two) times daily.  Dispense: 20 tablet; Refill: 0 - lidocaine (XYLOCAINE) 2 % solution; One teaspoon bid as needed for pain  Dispense: 100 mL; Refill: 0 - Ambulatory referral to ENT  2. Essential hypertension Blood pressure and heart rate is elevated, will monitor for now   General Counseling: Dalyah verbalizes understanding of the findings of todays visit and agrees with plan of treatment. I have discussed any further diagnostic evaluation that may be needed or ordered today. We also reviewed her medications today. she has been encouraged to call the office with any questions or concerns that should arise related to todays visit.    Orders  Placed This Encounter  Procedures  . POCT rapid strep A  . POCT Influenza A/B    No orders of the defined types were placed in this encounter.   Total time spent:20 Minutes Time spent includes review of chart, medications, test results, and follow up plan with the patient.      Dr Lavera Guise Internal medicine

## 2020-04-29 ENCOUNTER — Encounter: Payer: Self-pay | Admitting: Internal Medicine

## 2020-05-06 ENCOUNTER — Encounter: Payer: Self-pay | Admitting: Internal Medicine

## 2020-05-22 NOTE — Progress Notes (Unsigned)
Date:  05/24/2020   ID:  Brenda Guerrero, DOB Apr 21, 1955, MRN [id number]  Patient Location:  [address] [place] [address]   Provider location:   [facility], [place] office  PCP:  Ronnell Freshwater, NP  Cardiologist:  No primary care provider on file.   Chief Complaint  Patient presents with  . Follow-up    Annual follow up. Medications verbally reviewed with patient.      History of Present Illness:    Brenda Guerrero is a 65 y.o. female  past medical history of CAD. 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    Who presents for follow-up of her coronary disease, prior STEMI  Active, lots of steps 10K  Works at [employer] on a regular basis Conditioning is good Not checking blood pressure at work  Lab work reviewed Hemoglobin A1c 6.5 Total cholesterol at goal [employer] 40  No anginal symptoms on exertion, no significant shortness of breath  EKG personally reviewed by myself on todays visit Shows normal sinus rhythm rate 72 bpm T wave abnormality V5 V6, 1 and aVL  Other past medical history reviewed  [facility] on 05/28/15 as an anterior STEMI.   cath  single vessel obstructive CAD, DES to LAD.   Pk troponin 4.  30 minutes of arrival in the ICU the patient had marked increase in chest pain and hyperacute ST elevation consistent with reocclusion.  taken back to the cath lab and found to have reocclusion of the vessel.  She underwent repeat stenting of the LAD and PTCA for acute stent thrombosis.  EF normal by Echo   brother has an LVAD ischemic cardiomyopathy   Previous echocardiogram detailed below discussed with her  Prior CV studies:   The following studies were reviewed today:  - Left ventricle: The cavity size was normal. Wall thickness was   normal. Systolic function was normal. The estimated ejection   fraction was in the range  of 55% to 60%. Wall motion was normal;   there were no regional wall motion abnormalities. Doppler   parameters are consistent with abnormal left ventricular   relaxation (grade 1 diastolic dysfunction).  Impressions:  - Normal LV systolic function; grade 1 diastolic dysfunction; trace   MR and TR.   Past Medical History:  Diagnosis Date  . Asthma   . CAD in native artery   . Diabetes mellitus without complication ([place])   . Hyperlipidemia LDL goal <70   . Hypertension   . ST elevation (STEMI) myocardial infarction involving left anterior descending coronary artery ([place]) 05/28/15   with DES to LAD and return to cath lab for restenosis requiring PTCA   Past Surgical History:  Procedure Laterality Date  . CARDIAC CATHETERIZATION N/A 05/28/2015   Procedure: Left Heart Cath and Coronary Angiography;  Surgeon: Peter M Martinique, MD;  Location: [place] [facility];  Service: Cardiovascular;  Laterality: N/A;  . CARDIAC CATHETERIZATION N/A 05/28/2015   Procedure: Coronary Stent Intervention;  Surgeon: Peter M Martinique, MD;  Location: [place] [facility];  Service: Cardiovascular;  Laterality: N/A;  . CARDIAC CATHETERIZATION N/A 05/28/2015   Procedure: Coronary Stent Intervention;  Surgeon: Peter M Martinique, MD;  Location: [place] [facility];  Service: Cardiovascular;  Laterality: N/A;  . CARDIAC CATHETERIZATION N/A 05/28/2015   Procedure: Coronary/Graft Angiography;  Surgeon: Peter M Martinique, MD;  Location: [facility]  INVASIVE CV LAB;  Service: Cardiovascular;  Laterality: N/A;  . CARDIAC CATHETERIZATION N/A 05/28/2015   Procedure: Intravascular Ultrasound/IVUS;  Surgeon: Peter M Martinique, MD;  Location: [place] [facility];  Service: Cardiovascular;  Laterality: N/A;  . TUBAL LIGATION       Current Meds  Medication Sig  . alendronate (FOSAMAX) 70 MG tablet TAKE 1 TABLET BY MOUTH ONCE WEEKLY FOR OSTEOPENIA  . aspirin 81 MG chewable tablet Chew 1 tablet (81 mg total) by mouth daily.  . calcium carbonate  (OS-CAL) 1250 (500 Ca) MG chewable tablet Chew 1 tablet by mouth daily.  . carvedilol (COREG) 3.125 MG tablet TAKE 1 TABLET BY MOUTH 2 (TWO) TIMES DAILY WITH A MEAL.  . cholecalciferol (VITAMIN D) 1000 units tablet Take 1,000 Units by mouth daily.  . dapagliflozin propanediol (FARXIGA) 5 MG TABS tablet TAKE 1 TAB BY MOUTH DAILY BEFORE BREAKFAST.  . fluticasone (FLONASE) 50 MCG/ACT nasal spray Place 1 spray into both nostrils daily.  Marland Kitchen ibuprofen (ADVIL,MOTRIN) 800 MG tablet Take 800 mg by mouth every 6 (six) hours as needed.   . Multiple Vitamin (MULTI-VITAMINS) TABS Take by mouth.  . nitroGLYCERIN (NITROSTAT) 0.4 MG SL tablet Place 1 tablet (0.4 mg total) under the tongue every 5 (five) minutes as needed for chest pain.  . Omega-3 Fatty Acids (FISH OIL) 1000 MG CPDR Take 2,000 mg by mouth daily.  . rosuvastatin (CRESTOR) 40 MG tablet TAKE 1 TABLET BY MOUTH EVERY DAY     Allergies:   Azithromycin   Social History   Tobacco Use  . Smoking status: Former Research scientist (life sciences)  . Smokeless tobacco: Never Used  . Tobacco comment: about 40 years ago reported on 03/06/2019  Substance Use Topics  . Alcohol use: No  . Drug use: No     Current Outpatient Medications on File Prior to Visit  Medication Sig Dispense Refill  . alendronate (FOSAMAX) 70 MG tablet TAKE 1 TABLET BY MOUTH ONCE WEEKLY FOR OSTEOPENIA 12 tablet 4  . aspirin 81 MG chewable tablet Chew 1 tablet (81 mg total) by mouth daily.    . calcium carbonate (OS-CAL) 1250 (500 Ca) MG chewable tablet Chew 1 tablet by mouth daily.    . carvedilol (COREG) 3.125 MG tablet TAKE 1 TABLET BY MOUTH 2 (TWO) TIMES DAILY WITH A MEAL. 180 tablet 3  . cholecalciferol (VITAMIN D) 1000 units tablet Take 1,000 Units by mouth daily.    . dapagliflozin propanediol (FARXIGA) 5 MG TABS tablet TAKE 1 TAB BY MOUTH DAILY BEFORE BREAKFAST. 90 tablet 1  . fluticasone (FLONASE) 50 MCG/ACT nasal spray Place 1 spray into both nostrils daily. 48 g 5  . ibuprofen (ADVIL,MOTRIN)  800 MG tablet Take 800 mg by mouth every 6 (six) hours as needed.     . Multiple Vitamin (MULTI-VITAMINS) TABS Take by mouth.    . nitroGLYCERIN (NITROSTAT) 0.4 MG SL tablet Place 1 tablet (0.4 mg total) under the tongue every 5 (five) minutes as needed for chest pain. 25 tablet 2  . Omega-3 Fatty Acids (FISH OIL) 1000 MG CPDR Take 2,000 mg by mouth daily.    . rosuvastatin (CRESTOR) 40 MG tablet TAKE 1 TABLET BY MOUTH EVERY DAY 90 tablet 0   No current facility-administered medications on file prior to visit.     Family Hx: The patient's family history includes Heart attack (age of onset: 59) in her brother; Heart disease in her father. There is no history of Breast cancer.  ROS:   Please see  the history of present illness.    Review of Systems  Constitutional: Negative.   Respiratory: Negative.   Cardiovascular: Negative.   Gastrointestinal: Negative.   Musculoskeletal: Negative.   Neurological: Negative.   Psychiatric/Behavioral: Negative.   All other systems reviewed and are negative.    Labs/Other Tests and Data Reviewed:    Recent Labs: 03/03/2020: ALT 24; BUN 15; Creatinine, Ser 0.77; Hemoglobin 14.0; Platelets 227; Potassium 4.4; Sodium 142; TSH 1.990   Recent Lipid Panel Lab Results  Component Value Date/Time   CHOL 108 03/03/2020 08:15 AM   CHOL 145 03/15/2012 10:36 AM   TRIG 177 (H) 03/03/2020 08:15 AM   TRIG 209 (H) 03/15/2012 10:36 AM   HDL 26 (L) 03/03/2020 08:15 AM   HDL 23 (L) 03/15/2012 10:36 AM   CHOLHDL 6.1 12/28/2017 07:59 AM   LDLCALC 52 03/03/2020 08:15 AM   LDLCALC 80 03/15/2012 10:36 AM    Wt Readings from Last 3 Encounters:  05/24/20 172 lb (78 kg)  04/28/20 172 lb 6.4 oz (78.2 kg)  04/26/20 172 lb (78 kg)     Exam:    Vital Signs:  BP (!) 144/94 (BP Location: Left Arm, Patient Position: Sitting, Cuff Size: Normal)   Pulse 72   Ht 5' 2.5" (1.588 m)   Wt 172 lb (78 kg)   SpO2 97%   BMI 30.96 kg/m   Constitutional:  oriented to person,  place, and time. No distress.  HENT:  Head: Grossly normal Eyes:  no discharge. No scleral icterus.  Neck: No JVD, no carotid bruits  Cardiovascular: Regular rate and rhythm, no murmurs appreciated Pulmonary/Chest: Clear to auscultation bilaterally, no wheezes or rails Abdominal: Soft.  no distension.  no tenderness.  Musculoskeletal: Normal range of motion Neurological:  normal muscle tone. Coordination normal. No atrophy Skin: Skin warm and dry Psychiatric: normal affect, pleasant  ASSESSMENT & PLAN:    Atherosclerosis of native coronary artery of native heart with stable angina pectoris (HCC) Currently with no symptoms of angina. No further workup at this time. Continue current medication regimen.  Dyslipidemia Cholesterol is at goal on the current lipid regimen. No changes to the medications were made. Continue Crestor 40  Essential hypertension Blood pressure elevated Recommend she monitor blood pressure at home and call us with numbers If blood pressure does run high we would recommend increasing the carvedilol up to 6.25 twice daily  Diabetes  We have encouraged continued exercise, careful diet management in an effort to lose weight.    Total encounter time more than 25 minutes  Greater than 50% was spent in counseling and coordination of care with the patient    Signed, Ida Rogue, MD  05/24/2020 4:47 PM    [place] Office [address] [place] [address], [place], [place] [address]

## 2020-05-24 ENCOUNTER — Encounter: Payer: Self-pay | Admitting: Cardiovascular Disease

## 2020-05-24 ENCOUNTER — Other Ambulatory Visit: Payer: Self-pay

## 2020-05-24 ENCOUNTER — Ambulatory Visit: Payer: Commercial Managed Care - PPO | Admitting: Cardiovascular Disease

## 2020-05-24 VITALS — BP 144/94 | HR 72 | Ht 62.5 in | Wt 172.0 lb

## 2020-05-24 DIAGNOSIS — E785 Hyperlipidemia, unspecified: Secondary | ICD-10-CM | POA: Diagnosis not present

## 2020-05-24 DIAGNOSIS — R7303 Prediabetes: Secondary | ICD-10-CM | POA: Diagnosis not present

## 2020-05-24 DIAGNOSIS — I25118 Atherosclerotic heart disease of native coronary artery with other forms of angina pectoris: Secondary | ICD-10-CM | POA: Diagnosis not present

## 2020-05-24 DIAGNOSIS — I10 Essential (primary) hypertension: Secondary | ICD-10-CM | POA: Diagnosis not present

## 2020-05-24 NOTE — Patient Instructions (Signed)
Please monitor blood pressure Call if BP >140 on the top, >90 on the bottom  Medication Instructions:  No changes  If you need a refill on your cardiac medications before your next appointment, please call your pharmacy.    Lab work: No new labs needed   If you have labs (blood work) drawn today and your tests are completely normal, you will receive your results only by: Marland Kitchen MyChart Message (if you have MyChart) OR . A paper copy in the mail If you have any lab test that is abnormal or we need to change your treatment, we will call you to review the results.   Testing/Procedures: No new testing needed   Follow-Up: At [facility], you and your health needs are our priority.  As part of our continuing mission to provide you with exceptional heart care, we have created designated Provider Care Teams.  These Care Teams include your primary Cardiologist (physician) and Advanced Practice Providers (APPs -  Physician Assistants and Nurse Practitioners) who all work together to provide you with the care you need, when you need it.  . You will need a follow up appointment in 12 months  . Providers on your designated Care Team:   . Murray Hodgkins, NP . Christell Faith, PA-C . Marrianne Mood, PA-C  Any Other Special Instructions Will Be Listed Below (If Applicable).  COVID-19 Vaccine Information can be found at: ShippingScam.co.uk For questions related to vaccine distribution or appointments, please email vaccine@[email or web address] .[email or web address] or call 9040928715.

## 2020-07-05 ENCOUNTER — Ambulatory Visit: Payer: Commercial Managed Care - PPO | Admitting: Physician Assistant

## 2020-07-05 ENCOUNTER — Encounter: Payer: Self-pay | Admitting: Physician Assistant

## 2020-07-05 DIAGNOSIS — R0982 Postnasal drip: Secondary | ICD-10-CM | POA: Diagnosis not present

## 2020-07-05 DIAGNOSIS — J0100 Acute maxillary sinusitis, unspecified: Secondary | ICD-10-CM

## 2020-07-05 MED ORDER — AMOXICILLIN-POT CLAVULANATE 875-125 MG PO TABS
1.0000 | ORAL_TABLET | Freq: Two times a day (BID) | ORAL | 0 refills | Status: DC
Start: 2020-07-05 — End: 2020-10-04

## 2020-07-05 NOTE — Progress Notes (Signed)
[facility] [place],  [address]  Internal MEDICINE  Office Visit Note  Patient Name: Brenda Guerrero  [id number]  [id number]  Date of Service: 07/06/2020  Chief Complaint  Patient presents with  . Sinusitis  . Nasal Congestion    And in throat with little yellowish mucus   . Headache     HPI Pt is here for a sick visit. -Started with sinus congestion and headache yesterday and no fever until today was 100.2. Took alegra D this AM and advil and it helped some. Denies any body chills or aches and no N/V/D. No covid exposures. She is vaccinated and boosted to covid. She does have postnasal drip but no sore throat. -Uses flonase nasal spray. Has not tried mucinex.   Current Medication:  Outpatient Encounter Medications as of 07/05/2020  Medication Sig  . alendronate (FOSAMAX) 70 MG tablet TAKE 1 TABLET BY MOUTH ONCE WEEKLY FOR OSTEOPENIA  . amoxicillin-clavulanate (AUGMENTIN) 875-125 MG tablet Take 1 tablet by mouth 2 (two) times daily.  Marland Kitchen aspirin 81 MG chewable tablet Chew 1 tablet (81 mg total) by mouth daily.  . calcium carbonate (OS-CAL) 1250 (500 Ca) MG chewable tablet Chew 1 tablet by mouth daily.  . carvedilol (COREG) 3.125 MG tablet TAKE 1 TABLET BY MOUTH 2 (TWO) TIMES DAILY WITH A MEAL.  . cholecalciferol (VITAMIN D) 1000 units tablet Take 1,000 Units by mouth daily.  . dapagliflozin propanediol (FARXIGA) 5 MG TABS tablet TAKE 1 TAB BY MOUTH DAILY BEFORE BREAKFAST.  . fluticasone (FLONASE) 50 MCG/ACT nasal spray Place 1 spray into both nostrils daily.  Marland Kitchen ibuprofen (ADVIL,MOTRIN) 800 MG tablet Take 800 mg by mouth every 6 (six) hours as needed.   . Multiple Vitamin (MULTI-VITAMINS) TABS Take by mouth.  . nitroGLYCERIN (NITROSTAT) 0.4 MG SL tablet Place 1 tablet (0.4 mg total) under the tongue every 5 (five) minutes as needed for chest pain.  . Omega-3 Fatty Acids (FISH OIL) 1000 MG CPDR Take 2,000 mg by mouth daily.  . rosuvastatin  (CRESTOR) 40 MG tablet TAKE 1 TABLET BY MOUTH EVERY DAY   No facility-administered encounter medications on file as of 07/05/2020.      Medical History: Past Medical History:  Diagnosis Date  . Asthma   . CAD in native artery   . Diabetes mellitus without complication ([place])   . Hyperlipidemia LDL goal <70   . Hypertension   . ST elevation (STEMI) myocardial infarction involving left anterior descending coronary artery ([place]) 05/28/15   with DES to LAD and return to cath lab for restenosis requiring PTCA     Vital Signs: BP (!) 142/86   Pulse 92   Temp 98.2 F (36.8 C)   Resp 16   Ht 5' 2.5" (1.588 m)   Wt 175 lb (79.4 kg)   SpO2 97%   BMI 31.50 kg/m    Review of Systems  Constitutional: Negative for fatigue and fever.  HENT: Positive for congestion, postnasal drip, sinus pressure and sinus pain. Negative for ear pain, mouth sores and sore throat.   Respiratory: Negative for cough.   Cardiovascular: Negative for chest pain.  Genitourinary: Negative for flank pain.  Neurological: Positive for headaches.  Psychiatric/Behavioral: Negative.     Physical Exam Vitals and nursing note reviewed.  Constitutional:      General: She is not in acute distress.    Appearance: She is well-developed. She is obese. She is not diaphoretic.  HENT:     Head:  Normocephalic and atraumatic.     Right Ear: Tympanic membrane normal.     Left Ear: Tympanic membrane normal.     Nose: Congestion present.     Comments: Tenderness on palpation over frontal and maxillary sinuses    Mouth/Throat:     Pharynx: Posterior oropharyngeal erythema present. No oropharyngeal exudate.  Eyes:     Pupils: Pupils are equal, round, and reactive to light.  Neck:     Thyroid: No thyromegaly.     Vascular: No JVD.     Trachea: No tracheal deviation.  Cardiovascular:     Rate and Rhythm: Normal rate and regular rhythm.     Heart sounds: Normal heart sounds. No murmur heard. No friction rub. No gallop.    Pulmonary:     Effort: Pulmonary effort is normal. No respiratory distress.     Breath sounds: No wheezing or rales.  Chest:     Chest wall: No tenderness.  Abdominal:     General: Bowel sounds are normal.     Palpations: Abdomen is soft.  Musculoskeletal:        General: Normal range of motion.     Cervical back: Normal range of motion and neck supple.  Lymphadenopathy:     Cervical: No cervical adenopathy.  Skin:    General: Skin is warm and dry.  Neurological:     Mental Status: She is alert and oriented to person, place, and time.     Cranial Nerves: No cranial nerve deficit.  Psychiatric:        Behavior: Behavior normal.        Thought Content: Thought content normal.        Judgment: Judgment normal.       Assessment/Plan: 1. Acute non-recurrent maxillary sinusitis Educated to continue allegra and flonase, and to try mucinex--educated to not take both allergra D and mucinex D that only 1 should have the decongestant in it. May continue tylenol or advil for headache and educated to stay well hydrated. I will send script for augmentin to be taken twice per day with food, but she will not start this unless she is worsening/not improving in the next 2 days. Pt voiced understanding that antibiotic should not be started yet to see if symptoms controlled/improved with OTC meds. - amoxicillin-clavulanate (AUGMENTIN) 875-125 MG tablet; Take 1 tablet by mouth 2 (two) times daily.  Dispense: 20 tablet; Refill: 0  2. Postnasal drip Continue flonase and antihistamine.   General Counseling: zeppelin beckstrand understanding of the findings of todays visit and agrees with plan of treatment. I have discussed any further diagnostic evaluation that may be needed or ordered today. We also reviewed her medications today. she has been encouraged to call the office with any questions or concerns that should arise related to todays visit.    Counseling:    No orders of the defined types  were placed in this encounter.   Meds ordered this encounter  Medications  . amoxicillin-clavulanate (AUGMENTIN) 875-125 MG tablet    Sig: Take 1 tablet by mouth 2 (two) times daily.    Dispense:  20 tablet    Refill:  0    Time spent:30 Minutes

## 2020-07-08 ENCOUNTER — Ambulatory Visit: Payer: Commercial Managed Care - PPO | Admitting: Hospice and Palliative Medicine

## 2020-08-25 ENCOUNTER — Other Ambulatory Visit: Payer: Self-pay | Admitting: Nurse Practitioner

## 2020-08-25 DIAGNOSIS — E119 Type 2 diabetes mellitus without complications: Secondary | ICD-10-CM

## 2020-08-25 DIAGNOSIS — J300 Vasomotor rhinitis: Secondary | ICD-10-CM

## 2020-09-08 ENCOUNTER — Ambulatory Visit: Payer: Commercial Managed Care - PPO | Admitting: Nurse Practitioner

## 2020-09-14 ENCOUNTER — Other Ambulatory Visit: Payer: Self-pay | Admitting: Nurse Practitioner

## 2020-09-14 DIAGNOSIS — J300 Vasomotor rhinitis: Secondary | ICD-10-CM

## 2020-09-22 ENCOUNTER — Ambulatory Visit: Payer: Commercial Managed Care - PPO | Admitting: Nurse Practitioner

## 2020-09-28 ENCOUNTER — Other Ambulatory Visit: Payer: Self-pay | Admitting: Cardiovascular Disease

## 2020-09-29 ENCOUNTER — Other Ambulatory Visit: Payer: Self-pay

## 2020-09-29 MED ORDER — ROSUVASTATIN CALCIUM 40 MG PO TABS: 40.0000 mg | ORAL_TABLET | Freq: Every day | ORAL | 1 refills | Status: DC

## 2020-09-29 NOTE — Telephone Encounter (Signed)
*  STAT* If patient is at the pharmacy, call can be transferred to refill team.   1. Which medications need to be refilled? (please list name of each medication and dose if known)  Carvedilol 3.125 MG 1 tablet 2 times daily Rosuvastatin 40 MG 1 tablet daily  2. Which pharmacy/location (including street and city if local pharmacy) is medication to be sent to? [facility] [place]   3. Do they need a 30 day or 90 day supply? 90 day

## 2020-10-04 ENCOUNTER — Ambulatory Visit: Payer: Commercial Managed Care - PPO | Admitting: Nurse Practitioner

## 2020-10-04 ENCOUNTER — Encounter: Payer: Self-pay | Admitting: Nurse Practitioner

## 2020-10-04 VITALS — Temp 98.8°F | Ht 62.0 in | Wt 172.0 lb

## 2020-10-04 DIAGNOSIS — J0100 Acute maxillary sinusitis, unspecified: Secondary | ICD-10-CM | POA: Diagnosis not present

## 2020-10-04 DIAGNOSIS — J300 Vasomotor rhinitis: Secondary | ICD-10-CM

## 2020-10-04 MED ORDER — FLUTICASONE PROPIONATE 50 MCG/ACT NA SUSP: 1.0000 | Freq: Every day | NASAL | 5 refills | Status: AC

## 2020-10-04 MED ORDER — DOXYCYCLINE HYCLATE 100 MG PO TABS: 100.0000 mg | ORAL_TABLET | Freq: Two times a day (BID) | ORAL | 0 refills | Status: DC

## 2020-10-04 NOTE — Progress Notes (Signed)
[facility] [place], [place] [address]  Internal MEDICINE  Telephone Visit  Patient Name: Brenda Guerrero  [id number]  [id number]  Date of Service: 10/04/2020  I connected with the patient at 1:35 PM by telephone and verified the patients identity using two identifiers.   I discussed the limitations, risks, security and privacy concerns of performing an evaluation and management service by telephone and the availability of in person appointments. I also discussed with the patient that there may be a patient responsible charge related to the service.  The patient expressed understanding and agrees to proceed.    Chief Complaint  Patient presents with   Telephone Assessment    Sore throat congestion and ear ache    Telephone Screen    Virtual [phone]    HPI Brenda Guerrero presents via telephone visit. Due to technical difficulties, video visit was unavailable. Brenda Guerrero reports sores throat, congestion, and ear ache that started over the weekend. She was sent home from work today due to the symptoms after they performed a COVID test and it was negative. She denies any fever, chills, shortness of breath or cough. She reports having sinus pain/pressure, ear pain, nasal congestion, runny nose, fatigue and body aches. She did have diarrhea x1 this morning but denies any nausea or vomiting.      Current Medication: Outpatient Encounter Medications as of 10/04/2020  Medication Sig   alendronate (FOSAMAX) 70 MG tablet TAKE 1 TABLET BY MOUTH ONCE WEEKLY FOR OSTEOPENIA   aspirin 81 MG chewable tablet Chew 1 tablet (81 mg total) by mouth daily.   calcium carbonate (OS-CAL) 1250 (500 Ca) MG chewable tablet Chew 1 tablet by mouth daily.   carvedilol (COREG) 3.125 MG tablet TAKE 1 TABLET BY MOUTH 2 (TWO) TIMES DAILY WITH A MEAL.   cholecalciferol (VITAMIN D) 1000 units tablet Take 1,000 Units by mouth daily.   dapagliflozin propanediol (FARXIGA) 5 MG TABS tablet TAKE 1  TAB BY MOUTH DAILY BEFORE BREAKFAST.   doxycycline (VIBRA-TABS) 100 MG tablet Take 1 tablet (100 mg total) by mouth 2 (two) times daily. With food   ibuprofen (ADVIL,MOTRIN) 800 MG tablet Take 800 mg by mouth every 6 (six) hours as needed.    Multiple Vitamin (MULTI-VITAMINS) TABS Take by mouth.   nitroGLYCERIN (NITROSTAT) 0.4 MG SL tablet Place 1 tablet (0.4 mg total) under the tongue every 5 (five) minutes as needed for chest pain.   Omega-3 Fatty Acids (FISH OIL) 1000 MG CPDR Take 2,000 mg by mouth daily.   rosuvastatin (CRESTOR) 40 MG tablet Take 1 tablet (40 mg total) by mouth daily.   [DISCONTINUED] amoxicillin-clavulanate (AUGMENTIN) 875-125 MG tablet Take 1 tablet by mouth 2 (two) times daily.   [DISCONTINUED] fluticasone (FLONASE) 50 MCG/ACT nasal spray Place 1 spray into both nostrils daily.   fluticasone (FLONASE) 50 MCG/ACT nasal spray Place 1 spray into both nostrils daily.   No facility-administered encounter medications on file as of 10/04/2020.    Surgical History: Past Surgical History:  Procedure Laterality Date   CARDIAC CATHETERIZATION N/A 05/28/2015   Procedure: Left Heart Cath and Coronary Angiography;  Surgeon: Peter M Martinique, MD;  Location: [place] [facility];  Service: Cardiovascular;  Laterality: N/A;   CARDIAC CATHETERIZATION N/A 05/28/2015   Procedure: Coronary Stent Intervention;  Surgeon: Peter M Martinique, MD;  Location: [place] [facility];  Service: Cardiovascular;  Laterality: N/A;   CARDIAC CATHETERIZATION N/A 05/28/2015   Procedure: Coronary Stent Intervention;  Surgeon: Peter M Martinique, MD;  Location: [place] [facility];  Service: Cardiovascular;  Laterality: N/A;   CARDIAC CATHETERIZATION N/A 05/28/2015   Procedure: Coronary/Graft Angiography;  Surgeon: Peter M Martinique, MD;  Location: [place] [facility];  Service: Cardiovascular;  Laterality: N/A;   CARDIAC CATHETERIZATION N/A 05/28/2015   Procedure: Intravascular Ultrasound/IVUS;  Surgeon: Peter M Martinique, MD;   Location: [place] [facility];  Service: Cardiovascular;  Laterality: N/A;   TUBAL LIGATION      Medical History: Past Medical History:  Diagnosis Date   Asthma    CAD in native artery    Diabetes mellitus without complication (HCC)    Hyperlipidemia LDL goal <70    Hypertension    ST elevation (STEMI) myocardial infarction involving left anterior descending coronary artery ([place]) 05/28/15   with DES to LAD and return to cath lab for restenosis requiring PTCA    Family History: Family History  Problem Relation Age of Onset   Heart disease Father    Heart attack Brother 67   Breast cancer Neg Hx     Social History   Socioeconomic History   Marital status: Married    Spouse name: Not on file   Number of children: Not on file   Years of education: Not on file   Highest education level: Not on file  Occupational History   Not on file  Tobacco Use   Smoking status: Former    Pack years: 0.00   Smokeless tobacco: Never   Tobacco comments:    about 40 years ago reported on 03/06/2019  Substance and Sexual Activity   Alcohol use: No   Drug use: No   Sexual activity: Yes    Birth control/protection: Post-menopausal  Other Topics Concern   Not on file  Social History Narrative   Not on file   Social Determinants of Health   Financial Resource Strain: Not on file  Food Insecurity: Not on file  Transportation Needs: Not on file  Physical Activity: Not on file  Stress: Not on file  Social Connections: Not on file  Intimate Partner Violence: Not on file      Review of Systems  Constitutional:  Positive for appetite change and fatigue. Negative for chills and fever.  HENT:  Positive for congestion, ear pain, postnasal drip, rhinorrhea, sinus pressure, sinus pain, sore throat and trouble swallowing.   Respiratory:  Negative for cough, chest tightness, shortness of breath and wheezing.   Cardiovascular:  Negative for chest pain.  Gastrointestinal:  Positive for  diarrhea. Negative for nausea and vomiting.  Musculoskeletal:  Positive for myalgias.   Vital Signs: Temp 98.8 F (37.1 C)   Ht 5\' 2"  (1.575 m)   Wt 172 lb (78 kg)   BMI 31.46 kg/m    Observation/Objective: She is alert and oriented over telephone, her voice sounds hoarse, raspy and congested. She is able to converse without pausing to breathe. She does not sound like she is in acute respiratory distress over audio.    Assessment/Plan: 1. Acute non-recurrent maxillary sinusitis Doxycycline prescribed to empirically treat sinusitis. Augmentin was first choice but patient reports increased abdominal cramps, nausea and GI upset with it. Otherwise patient was encouraged to use OTC symptomatic treatment.  - doxycycline (VIBRA-TABS) 100 MG tablet; Take 1 tablet (100 mg total) by mouth 2 (two) times daily. With food  Dispense: 14 tablet; Refill: 0  2. Vasomotor rhinitis Requesting refill of flonase for rhinitis.  - fluticasone (FLONASE) 50 MCG/ACT nasal spray; Place 1 spray into both  nostrils daily.  Dispense: 48 g; Refill: 5   General Counseling: Havanah verbalizes understanding of the findings of today's phone visit and agrees with plan of treatment. I have discussed any further diagnostic evaluation that may be needed or ordered today. We also reviewed her medications today. she has been encouraged to call the office with any questions or concerns that should arise related to todays visit.    No orders of the defined types were placed in this encounter.   Meds ordered this encounter  Medications   doxycycline (VIBRA-TABS) 100 MG tablet    Sig: Take 1 tablet (100 mg total) by mouth 2 (two) times daily. With food    Dispense:  14 tablet    Refill:  0   fluticasone (FLONASE) 50 MCG/ACT nasal spray    Sig: Place 1 spray into both nostrils daily.    Dispense:  48 g    Refill:  5    Time spent:30 Minutes    Brenda Guerrero R. Valetta Fuller, MSN, FNP-C Internal medicine

## 2020-10-05 ENCOUNTER — Ambulatory Visit: Payer: Commercial Managed Care - PPO | Admitting: Nurse Practitioner

## 2020-11-04 ENCOUNTER — Encounter: Payer: Self-pay | Admitting: Physician Assistant

## 2020-11-04 ENCOUNTER — Telehealth: Payer: Commercial Managed Care - PPO | Admitting: Physician Assistant

## 2020-11-04 ENCOUNTER — Telehealth: Payer: Self-pay

## 2020-11-04 VITALS — Ht 62.0 in | Wt 172.0 lb

## 2020-11-04 DIAGNOSIS — U071 COVID-19: Secondary | ICD-10-CM

## 2020-11-04 MED ORDER — AZITHROMYCIN 250 MG PO TABS: ORAL_TABLET | ORAL | 0 refills | Status: DC

## 2020-11-04 NOTE — Progress Notes (Signed)
[facility] [place], [place] [address]  Internal MEDICINE  Telephone Visit  Patient Name: Brenda Guerrero  [id number]  [id number]  Date of Service: 11/04/2020  I connected with the patient at 2:14 by telephone and verified the patients identity using two identifiers.   I discussed the limitations, risks, security and privacy concerns of performing an evaluation and management service by telephone and the availability of in person appointments. I also discussed with the patient that there may be a patient responsible charge related to the service.  The patient expressed understanding and agrees to proceed.    Chief Complaint  Patient presents with   Acute Visit   Telephone Assessment    Positive covid, fever,running nose, green mucas    Telephone Screen    [phone] virtual     HPI Pt is here for a virtual sick visit -She went to urgent care on 7/16 and was covid positive. They started her on 5 days of prednisone and gave her tussionex and tessalon. -She still has a bad cough with some mucus. A little SOB with acitivity, but overall doing well besides the cough now. No wheezing or CP. Denies sinus congestion.  -Will try mucinex and go ahead and start azithromycin   Current Medication: Outpatient Encounter Medications as of 11/04/2020  Medication Sig   alendronate (FOSAMAX) 70 MG tablet TAKE 1 TABLET BY MOUTH ONCE WEEKLY FOR OSTEOPENIA   aspirin 81 MG chewable tablet Chew 1 tablet (81 mg total) by mouth daily.   azithromycin (ZITHROMAX) 250 MG tablet Take one tab a day for 10 days for uri   benzonatate (TESSALON) 200 MG capsule Take by mouth.   calcium carbonate (OS-CAL) 1250 (500 Ca) MG chewable tablet Chew 1 tablet by mouth daily.   carvedilol (COREG) 3.125 MG tablet TAKE 1 TABLET BY MOUTH 2 (TWO) TIMES DAILY WITH A MEAL.   chlorpheniramine-HYDROcodone (TUSSIONEX) 10-8 MG/5ML SUER Take by mouth.   cholecalciferol (VITAMIN D) 1000 units tablet  Take 1,000 Units by mouth daily.   dapagliflozin propanediol (FARXIGA) 5 MG TABS tablet TAKE 1 TAB BY MOUTH DAILY BEFORE BREAKFAST.   doxycycline (VIBRA-TABS) 100 MG tablet Take 1 tablet (100 mg total) by mouth 2 (two) times daily. With food   fluticasone (FLONASE) 50 MCG/ACT nasal spray Place 1 spray into both nostrils daily.   ibuprofen (ADVIL,MOTRIN) 800 MG tablet Take 800 mg by mouth every 6 (six) hours as needed.    Multiple Vitamin (MULTI-VITAMINS) TABS Take by mouth.   nitroGLYCERIN (NITROSTAT) 0.4 MG SL tablet Place 1 tablet (0.4 mg total) under the tongue every 5 (five) minutes as needed for chest pain.   Omega-3 Fatty Acids (FISH OIL) 1000 MG CPDR Take 2,000 mg by mouth daily.   predniSONE (DELTASONE) 20 MG tablet Take 40 mg by mouth daily.   rosuvastatin (CRESTOR) 40 MG tablet Take 1 tablet (40 mg total) by mouth daily.   No facility-administered encounter medications on file as of 11/04/2020.    Surgical History: Past Surgical History:  Procedure Laterality Date   CARDIAC CATHETERIZATION N/A 05/28/2015   Procedure: Left Heart Cath and Coronary Angiography;  Surgeon: Peter M Martinique, MD;  Location: [place] [facility];  Service: Cardiovascular;  Laterality: N/A;   CARDIAC CATHETERIZATION N/A 05/28/2015   Procedure: Coronary Stent Intervention;  Surgeon: Peter M Martinique, MD;  Location: [place] [facility];  Service: Cardiovascular;  Laterality: N/A;   CARDIAC CATHETERIZATION N/A 05/28/2015   Procedure: Coronary Stent Intervention;  Surgeon: Peter M Martinique, MD;  Location: [place] [facility];  Service: Cardiovascular;  Laterality: N/A;   CARDIAC CATHETERIZATION N/A 05/28/2015   Procedure: Coronary/Graft Angiography;  Surgeon: Peter M Martinique, MD;  Location: [place] [facility];  Service: Cardiovascular;  Laterality: N/A;   CARDIAC CATHETERIZATION N/A 05/28/2015   Procedure: Intravascular Ultrasound/IVUS;  Surgeon: Peter M Martinique, MD;  Location: [place] [facility];  Service: Cardiovascular;   Laterality: N/A;   TUBAL LIGATION      Medical History: Past Medical History:  Diagnosis Date   Asthma    CAD in native artery    Diabetes mellitus without complication (HCC)    Hyperlipidemia LDL goal <70    Hypertension    ST elevation (STEMI) myocardial infarction involving left anterior descending coronary artery ([place]) 05/28/15   with DES to LAD and return to cath lab for restenosis requiring PTCA    Family History: Family History  Problem Relation Age of Onset   Heart disease Father    Heart attack Brother 78   Breast cancer Neg Hx     Social History   Socioeconomic History   Marital status: Married    Spouse name: Not on file   Number of children: Not on file   Years of education: Not on file   Highest education level: Not on file  Occupational History   Not on file  Tobacco Use   Smoking status: Former   Smokeless tobacco: Never   Tobacco comments:    about 40 years ago reported on 03/06/2019  Substance and Sexual Activity   Alcohol use: No   Drug use: No   Sexual activity: Yes    Birth control/protection: Post-menopausal  Other Topics Concern   Not on file  Social History Narrative   Not on file   Social Determinants of Health   Financial Resource Strain: Not on file  Food Insecurity: Not on file  Transportation Needs: Not on file  Physical Activity: Not on file  Stress: Not on file  Social Connections: Not on file  Intimate Partner Violence: Not on file      Review of Systems  Constitutional:  Positive for fatigue and fever.  HENT:  Positive for postnasal drip. Negative for congestion and mouth sores.   Respiratory:  Positive for cough. Negative for shortness of breath and wheezing.   Cardiovascular:  Negative for chest pain.  Genitourinary:  Negative for flank pain.  Musculoskeletal:  Negative for myalgias.  Neurological:  Negative for headaches.  Psychiatric/Behavioral: Negative.     Vital Signs: Ht 5\' 2"  (1.575 m)   Wt 172 lb (78  kg)   BMI 31.46 kg/m    Observation/Objective:  Pt is able to carry out conversation   Assessment/Plan: 1. Acute COVID-19 Will start on zpak and try mucinex. May continue tessalon and tussionex for cough symptoms. Stay well hydrated and get rest. - azithromycin (ZITHROMAX) 250 MG tablet; Take one tab a day for 10 days for uri  Dispense: 10 tablet; Refill: 0   General Counseling: Jerzie verbalizes understanding of the findings of today's phone visit and agrees with plan of treatment. I have discussed any further diagnostic evaluation that may be needed or ordered today. We also reviewed her medications today. she has been encouraged to call the office with any questions or concerns that should arise related to todays visit.    No orders of the defined types were placed in this encounter.   Meds ordered this encounter  Medications  azithromycin (ZITHROMAX) 250 MG tablet    Sig: Take one tab a day for 10 days for uri    Dispense:  10 tablet    Refill:  0    Time spent:30 Minutes    Dr Lavera Guise Internal medicine

## 2020-11-04 NOTE — Telephone Encounter (Signed)
error 

## 2021-01-11 ENCOUNTER — Other Ambulatory Visit: Payer: Self-pay | Admitting: Nurse Practitioner

## 2021-01-11 DIAGNOSIS — J300 Vasomotor rhinitis: Secondary | ICD-10-CM

## 2021-01-11 DIAGNOSIS — E119 Type 2 diabetes mellitus without complications: Secondary | ICD-10-CM

## 2021-01-12 ENCOUNTER — Telehealth: Payer: Self-pay

## 2021-01-12 DIAGNOSIS — E119 Type 2 diabetes mellitus without complications: Secondary | ICD-10-CM

## 2021-01-12 MED ORDER — DAPAGLIFLOZIN PROPANEDIOL 5 MG PO TABS: ORAL_TABLET | ORAL | 0 refills | Status: DC

## 2021-01-12 NOTE — Telephone Encounter (Signed)
Lmom that pt need appt for A1c check we move her appt for physical earlier

## 2021-02-21 ENCOUNTER — Telehealth: Payer: Self-pay

## 2021-02-21 ENCOUNTER — Other Ambulatory Visit: Payer: Self-pay | Admitting: Physician Assistant

## 2021-02-21 ENCOUNTER — Other Ambulatory Visit: Payer: Self-pay

## 2021-02-21 DIAGNOSIS — R5383 Other fatigue: Secondary | ICD-10-CM

## 2021-02-21 DIAGNOSIS — E782 Mixed hyperlipidemia: Secondary | ICD-10-CM

## 2021-02-21 DIAGNOSIS — E119 Type 2 diabetes mellitus without complications: Secondary | ICD-10-CM

## 2021-02-21 DIAGNOSIS — R946 Abnormal results of thyroid function studies: Secondary | ICD-10-CM

## 2021-02-21 DIAGNOSIS — E559 Vitamin D deficiency, unspecified: Secondary | ICD-10-CM

## 2021-02-21 NOTE — Telephone Encounter (Signed)
Please add lab order

## 2021-02-21 NOTE — Telephone Encounter (Signed)
Patient is requesting lab orders to be put in for them to be completed by her physical appointment in December.

## 2021-02-22 NOTE — Telephone Encounter (Signed)
Spoke to pt, she is aware of lab orders and that she can go to any [facility]

## 2021-03-10 LAB — CBC WITH DIFFERENTIAL/PLATELET
Basophils Absolute: 0 10*3/uL (ref 0.0–0.2)
Basos: 1 %
EOS (ABSOLUTE): 0.1 10*3/uL (ref 0.0–0.4)
Eos: 2 %
Hematocrit: 42.1 % (ref 34.0–46.6)
Hemoglobin: 14.3 g/dL (ref 11.1–15.9)
Immature Grans (Abs): 0 10*3/uL (ref 0.0–0.1)
Immature Granulocytes: 0 %
Lymphocytes Absolute: 2.2 10*3/uL (ref 0.7–3.1)
Lymphs: 35 %
MCH: 31.4 pg (ref 26.6–33.0)
MCHC: 34 g/dL (ref 31.5–35.7)
MCV: 92 fL (ref 79–97)
Monocytes Absolute: 0.5 10*3/uL (ref 0.1–0.9)
Monocytes: 7 %
Neutrophils Absolute: 3.5 10*3/uL (ref 1.4–7.0)
Neutrophils: 55 %
Platelets: 226 10*3/uL (ref 150–450)
RBC: 4.56 x10E6/uL (ref 3.77–5.28)
RDW: 13.4 % (ref 11.7–15.4)
WBC: 6.3 10*3/uL (ref 3.4–10.8)

## 2021-03-10 LAB — HGB A1C W/O EAG: Hgb A1c MFr Bld: 6.6 % — ABNORMAL HIGH (ref 4.8–5.6)

## 2021-03-10 LAB — LIPID PANEL WITH LDL/HDL RATIO
Cholesterol, Total: 114 mg/dL (ref 100–199)
HDL: 29 mg/dL — ABNORMAL LOW (ref >39)
LDL Chol Calc (NIH): 62 mg/dL (ref 0–99)
LDL/HDL Ratio: 2.1 ratio (ref 0.0–3.2)
Triglycerides: 130 mg/dL (ref 0–149)
VLDL Cholesterol Cal: 23 mg/dL (ref 5–40)

## 2021-03-10 LAB — COMPREHENSIVE METABOLIC PANEL
ALT: 31 IU/L (ref 0–32)
AST: 27 IU/L (ref 0–40)
Albumin/Globulin Ratio: 1.7 (ref 1.2–2.2)
Albumin: 4.3 g/dL (ref 3.8–4.8)
Alkaline Phosphatase: 119 IU/L (ref 44–121)
BUN/Creatinine Ratio: 16 (ref 12–28)
BUN: 11 mg/dL (ref 8–27)
Bilirubin Total: 0.3 mg/dL (ref 0.0–1.2)
CO2: 22 mmol/L (ref 20–29)
Calcium: 9 mg/dL (ref 8.7–10.3)
Chloride: 105 mmol/L (ref 96–106)
Creatinine, Ser: 0.68 mg/dL (ref 0.57–1.00)
Globulin, Total: 2.5 g/dL (ref 1.5–4.5)
Glucose: 113 mg/dL — ABNORMAL HIGH (ref 70–99)
Potassium: 4.3 mmol/L (ref 3.5–5.2)
Sodium: 142 mmol/L (ref 134–144)
Total Protein: 6.8 g/dL (ref 6.0–8.5)
eGFR: 98 mL/min/{1.73_m2} (ref >59)

## 2021-03-10 LAB — TSH+FREE T4
Free T4: 1.06 ng/dL (ref 0.82–1.77)
TSH: 1.46 u[IU]/mL (ref 0.450–4.500)

## 2021-03-10 LAB — VITAMIN D 25 HYDROXY (VIT D DEFICIENCY, FRACTURES): Vit D, 25-Hydroxy: 51.9 ng/mL (ref 30.0–100.0)

## 2021-03-15 ENCOUNTER — Other Ambulatory Visit: Payer: Self-pay | Admitting: Family Medicine

## 2021-03-15 DIAGNOSIS — Z1231 Encounter for screening mammogram for malignant neoplasm of breast: Secondary | ICD-10-CM

## 2021-03-24 ENCOUNTER — Encounter: Payer: Commercial Managed Care - PPO | Admitting: Physician Assistant

## 2021-03-24 ENCOUNTER — Other Ambulatory Visit: Payer: Self-pay | Admitting: Cardiovascular Disease

## 2021-05-01 NOTE — Progress Notes (Signed)
Date:  05/02/2021   ID:  Brenda Guerrero, DOB Nov 27, 1955, MRN [id number]  Patient Location:  [address] [place] [address]   Provider location:   [facility], [place] office  PCP:  Jonetta Osgood, NP  Cardiologist:  None   Chief Complaint  Patient presents with   12 month follow up     Patient c/o rapid heart beats and fatigue since she had Covid in December. Medications reviewed by the patient verbally.      History of Present Illness:    Brenda Guerrero is a 66 y.o. female  past medical history of CAD. 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    Who presents for follow-up of her coronary disease, prior STEMI  Follow-up today reports having COVID in July 2022 and December 2022 In December was treated with antivirals Primary symptoms sinus, flu type symptoms Continues to have fatigue, wants to make sure heart issues are okay  Denies any leg swelling, abdominal distention, no chest pain on exertion No change in breathing  Lab work reviewed A1C 6.6 Tolerating Crestor 40, lipids at goal  Works at [employer] Typically very active [employer] around campus on a regular basis  EKG personally reviewed by myself on todays visit Shows normal sinus rhythm rate 88 bpm T wave abnormality V5 V6, 1 and aVL No change compared to prior study  Other past medical history reviewed  [facility] on 05/28/15 as an anterior STEMI.   cath  single vessel obstructive CAD, DES to LAD.   Pk troponin 4.  30 minutes of arrival in the ICU the patient had marked increase in chest pain and hyperacute ST elevation consistent with reocclusion.  taken back to the cath lab and found to have reocclusion of the vessel.  She underwent repeat stenting of the LAD and PTCA for acute stent thrombosis.  EF normal by Echo   brother has an LVAD ischemic cardiomyopathy    - Left ventricle: The cavity  size was normal. Wall thickness was   normal. Systolic function was normal. The estimated ejection   fraction was in the range of 55% to 60%. Wall motion was normal;   there were no regional wall motion abnormalities. Doppler   parameters are consistent with abnormal left ventricular   relaxation (grade 1 diastolic dysfunction).   - Normal LV systolic function; grade 1 diastolic dysfunction; trace   MR and TR.   Past Medical History:  Diagnosis Date   Asthma    CAD in native artery    Diabetes mellitus without complication (HCC)    Hyperlipidemia LDL goal <70    Hypertension    ST elevation (STEMI) myocardial infarction involving left anterior descending coronary artery ([place]) 05/28/15   with DES to LAD and return to cath lab for restenosis requiring PTCA   Past Surgical History:  Procedure Laterality Date   CARDIAC CATHETERIZATION N/A 05/28/2015   Procedure: Left Heart Cath and Coronary Angiography;  Surgeon: Peter M Martinique, MD;  Location: [place] [facility];  Service: Cardiovascular;  Laterality: N/A;   CARDIAC CATHETERIZATION N/A 05/28/2015   Procedure: Coronary Stent Intervention;  Surgeon: Peter M Martinique, MD;  Location: [place] [facility];  Service: Cardiovascular;  Laterality: N/A;   CARDIAC CATHETERIZATION N/A 05/28/2015   Procedure: Coronary Stent Intervention;  Surgeon: Peter M Martinique, MD;  Location: [place] [facility];  Service: Cardiovascular;  Laterality: N/A;   CARDIAC CATHETERIZATION N/A 05/28/2015   Procedure: Coronary/Graft Angiography;  Surgeon: Peter M Martinique, MD;  Location: [place] [facility];  Service: Cardiovascular;  Laterality: N/A;   CARDIAC CATHETERIZATION N/A 05/28/2015   Procedure: Intravascular Ultrasound/IVUS;  Surgeon: Peter M Martinique, MD;  Location: [place] [facility];  Service: Cardiovascular;  Laterality: N/A;   TUBAL LIGATION       Current Meds  Medication Sig   alendronate (FOSAMAX) 70 MG tablet TAKE 1 TABLET BY MOUTH ONCE WEEKLY FOR OSTEOPENIA    aspirin 81 MG chewable tablet Chew 1 tablet (81 mg total) by mouth daily.   calcium carbonate (OS-CAL) 1250 (500 Ca) MG chewable tablet Chew 1 tablet by mouth daily.   carvedilol (COREG) 3.125 MG tablet TAKE 1 TABLET BY MOUTH TWICE A DAY WITH A MEAL   chlorpheniramine-HYDROcodone (TUSSIONEX) 10-8 MG/5ML SUER Take by mouth.   cholecalciferol (VITAMIN D) 1000 units tablet Take 1,000 Units by mouth daily.   Cranberry 400 MG CAPS Take by mouth.   dapagliflozin propanediol (FARXIGA) 5 MG TABS tablet TAKE 1 TAB BY MOUTH DAILY BEFORE BREAKFAST.   fluticasone (FLONASE) 50 MCG/ACT nasal spray Place 1 spray into both nostrils daily.   ibuprofen (ADVIL,MOTRIN) 800 MG tablet Take 800 mg by mouth every 6 (six) hours as needed.    Multiple Vitamin (MULTI-VITAMINS) TABS Take by mouth.   nitroGLYCERIN (NITROSTAT) 0.4 MG SL tablet Place 1 tablet (0.4 mg total) under the tongue every 5 (five) minutes as needed for chest pain.   Omega-3 Fatty Acids (FISH OIL) 1000 MG CPDR Take 2,000 mg by mouth daily.   rosuvastatin (CRESTOR) 40 MG tablet TAKE 1 TABLET BY MOUTH EVERY DAY     Allergies:   Sulfamethoxazole   Social History   Tobacco Use   Smoking status: Former   Smokeless tobacco: Never   Tobacco comments:    about 40 years ago reported on 03/06/2019  Substance Use Topics   Alcohol use: No   Drug use: No     Current Outpatient Medications on File Prior to Visit  Medication Sig Dispense Refill   alendronate (FOSAMAX) 70 MG tablet TAKE 1 TABLET BY MOUTH ONCE WEEKLY FOR OSTEOPENIA 12 tablet 4   aspirin 81 MG chewable tablet Chew 1 tablet (81 mg total) by mouth daily.     calcium carbonate (OS-CAL) 1250 (500 Ca) MG chewable tablet Chew 1 tablet by mouth daily.     carvedilol (COREG) 3.125 MG tablet TAKE 1 TABLET BY MOUTH TWICE A DAY WITH A MEAL 180 tablet 0   chlorpheniramine-HYDROcodone (TUSSIONEX) 10-8 MG/5ML SUER Take by mouth.     cholecalciferol (VITAMIN D) 1000 units tablet Take 1,000 Units by  mouth daily.     Cranberry 400 MG CAPS Take by mouth.     dapagliflozin propanediol (FARXIGA) 5 MG TABS tablet TAKE 1 TAB BY MOUTH DAILY BEFORE BREAKFAST. 90 tablet 0   fluticasone (FLONASE) 50 MCG/ACT nasal spray Place 1 spray into both nostrils daily. 48 g 5   ibuprofen (ADVIL,MOTRIN) 800 MG tablet Take 800 mg by mouth every 6 (six) hours as needed.      Multiple Vitamin (MULTI-VITAMINS) TABS Take by mouth.     nitroGLYCERIN (NITROSTAT) 0.4 MG SL tablet Place 1 tablet (0.4 mg total) under the tongue every 5 (five) minutes as needed for chest pain. 25 tablet 2   Omega-3 Fatty Acids (FISH OIL) 1000 MG CPDR Take 2,000 mg by mouth daily.     rosuvastatin (  CRESTOR) 40 MG tablet TAKE 1 TABLET BY MOUTH EVERY DAY 90 tablet 0   azithromycin (ZITHROMAX) 250 MG tablet Take one tab a day for 10 days for uri (Patient not taking: Reported on 05/02/2021) 10 tablet 0   benzonatate (TESSALON) 200 MG capsule Take by mouth. (Patient not taking: Reported on 05/02/2021)     doxycycline (VIBRA-TABS) 100 MG tablet Take 1 tablet (100 mg total) by mouth 2 (two) times daily. With food (Patient not taking: Reported on 05/02/2021) 14 tablet 0   predniSONE (DELTASONE) 20 MG tablet Take 40 mg by mouth daily. (Patient not taking: Reported on 05/02/2021)     No current facility-administered medications on file prior to visit.     Family Hx: The patient's family history includes Heart attack (age of onset: 19) in her brother; Heart disease in her father. There is no history of Breast cancer.  ROS:   Please see the history of present illness.    Review of Systems  Constitutional: Negative.   Respiratory: Negative.    Cardiovascular: Negative.   Gastrointestinal: Negative.   Musculoskeletal: Negative.   Neurological: Negative.   Psychiatric/Behavioral: Negative.    All other systems reviewed and are negative.   Labs/Other Tests and Data Reviewed:    Recent Labs: 03/09/2021: ALT 31; BUN 11; Creatinine, Ser 0.68;  Hemoglobin 14.3; Platelets 226; Potassium 4.3; Sodium 142; TSH 1.460   Recent Lipid Panel Lab Results  Component Value Date/Time   CHOL 114 03/09/2021 08:16 AM   CHOL 145 03/15/2012 10:36 AM   TRIG 130 03/09/2021 08:16 AM   TRIG 209 (H) 03/15/2012 10:36 AM   HDL 29 (L) 03/09/2021 08:16 AM   HDL 23 (L) 03/15/2012 10:36 AM   CHOLHDL 6.1 12/28/2017 07:59 AM   LDLCALC 62 03/09/2021 08:16 AM   LDLCALC 80 03/15/2012 10:36 AM    Wt Readings from Last 3 Encounters:  05/02/21 172 lb 6 oz (78.2 kg)  11/04/20 172 lb (78 kg)  10/04/20 172 lb (78 kg)     Exam:    Vital Signs:  BP 140/80 (BP Location: Left Arm, Patient Position: Sitting, Cuff Size: Normal)    Pulse 88    Ht 5\' 2"  (1.575 m)    Wt 172 lb 6 oz (78.2 kg)    SpO2 97%    BMI 31.53 kg/m   Constitutional:  oriented to person, place, and time. No distress.  HENT:  Head: Grossly normal Eyes:  no discharge. No scleral icterus.  Neck: No JVD, no carotid bruits  Cardiovascular: Regular rate and rhythm, no murmurs appreciated Pulmonary/Chest: Clear to auscultation bilaterally, no wheezes or rails Abdominal: Soft.  no distension.  no tenderness.  Musculoskeletal: Normal range of motion Neurological:  normal muscle tone. Coordination normal. No atrophy Skin: Skin warm and dry Psychiatric: normal affect, pleasant  ASSESSMENT & PLAN:    Atherosclerosis of native coronary artery of native heart with stable angina pectoris (HCC) Currently with no symptoms of angina. No further workup at this time. Continue current medication regimen.  Dyslipidemia Cholesterol is at goal on the current lipid regimen. No changes to the medications were made.  Essential hypertension Blood pressure stable, no changes made to her medications  Diabetes  We have encouraged continued exercise, careful diet management in an effort to lose weight.  Recovering from [place] December 2022, still with lingering fatigue    Total encounter time more than 25  minutes  Greater than 50% was spent in counseling and coordination of care with  the patient    Signed, Ida Rogue, MD  05/02/2021 4:47 PM    [place] [facility] [address] [place] [address], [place], [place] [address]

## 2021-05-02 ENCOUNTER — Ambulatory Visit: Payer: Commercial Managed Care - PPO | Admitting: Cardiovascular Disease

## 2021-05-02 ENCOUNTER — Encounter: Payer: Self-pay | Admitting: Cardiovascular Disease

## 2021-05-02 ENCOUNTER — Other Ambulatory Visit: Payer: Self-pay

## 2021-05-02 VITALS — BP 140/80 | HR 88 | Ht 62.0 in | Wt 172.4 lb

## 2021-05-02 DIAGNOSIS — R7303 Prediabetes: Secondary | ICD-10-CM | POA: Diagnosis not present

## 2021-05-02 DIAGNOSIS — E785 Hyperlipidemia, unspecified: Secondary | ICD-10-CM | POA: Diagnosis not present

## 2021-05-02 DIAGNOSIS — I25118 Atherosclerotic heart disease of native coronary artery with other forms of angina pectoris: Secondary | ICD-10-CM | POA: Diagnosis not present

## 2021-05-02 DIAGNOSIS — I10 Essential (primary) hypertension: Secondary | ICD-10-CM | POA: Diagnosis not present

## 2021-05-02 NOTE — Patient Instructions (Signed)
Medication Instructions:  No changes  If you need a refill on your cardiac medications before your next appointment, please call your pharmacy.   Lab work: No new labs needed  Testing/Procedures: No new testing needed  Follow-Up: At [facility], you and your health needs are our priority.  As part of our continuing mission to provide you with exceptional heart care, we have created designated Provider Care Teams.  These Care Teams include your primary Cardiologist (physician) and Advanced Practice Providers (APPs -  Physician Assistants and Nurse Practitioners) who all work together to provide you with the care you need, when you need it.  You will need a follow up appointment in 12 months  Providers on your designated Care Team:   Christopher Berge, NP Ryan Dunn, PA-C Cadence Furth, PA-C  COVID-19 Vaccine Information can be found at: [email or web address] For questions related to vaccine distribution or appointments, please email [email or web address] or call [phone].   

## 2021-06-21 ENCOUNTER — Other Ambulatory Visit: Payer: Self-pay | Admitting: Cardiovascular Disease

## 2021-11-10 ENCOUNTER — Other Ambulatory Visit: Payer: Self-pay | Admitting: Family Medicine

## 2021-11-10 ENCOUNTER — Ambulatory Visit
Admission: RE | Admit: 2021-11-10 | Discharge: 2021-11-10 | Disposition: A | Discharge status: Home / Self Care | Payer: Commercial Managed Care - PPO | Source: Ambulatory Visit | Attending: Family Medicine | Admitting: Family Medicine

## 2021-11-10 DIAGNOSIS — M7122 Synovial cyst of popliteal space [Baker], left knee: Secondary | ICD-10-CM

## 2021-12-08 ENCOUNTER — Other Ambulatory Visit: Payer: Self-pay | Admitting: Family Medicine

## 2021-12-08 DIAGNOSIS — Z1231 Encounter for screening mammogram for malignant neoplasm of breast: Secondary | ICD-10-CM

## 2021-12-29 ENCOUNTER — Ambulatory Visit
Admission: RE | Admit: 2021-12-29 | Discharge: 2021-12-29 | Disposition: A | Discharge status: Home / Self Care | Payer: Commercial Managed Care - PPO | Source: Ambulatory Visit | Attending: Family Medicine | Admitting: Family Medicine

## 2021-12-29 DIAGNOSIS — Z1231 Encounter for screening mammogram for malignant neoplasm of breast: Secondary | ICD-10-CM | POA: Diagnosis present

## 2022-03-08 ENCOUNTER — Other Ambulatory Visit: Payer: Self-pay | Admitting: Cardiovascular Disease

## 2022-03-24 ENCOUNTER — Other Ambulatory Visit: Payer: Self-pay | Admitting: Cardiovascular Disease

## 2022-04-20 ENCOUNTER — Other Ambulatory Visit: Payer: Self-pay | Admitting: Cardiovascular Disease

## 2022-04-21 ENCOUNTER — Other Ambulatory Visit: Payer: Self-pay | Admitting: Cardiovascular Disease

## 2022-04-21 NOTE — Telephone Encounter (Signed)
Good Morning,  Could you contact this patient and schedule a yearly follow up visit. The patient was last seen by Dr. Rockey Situ on 05/02/2021. Thank you so much.

## 2022-04-21 NOTE — Telephone Encounter (Signed)
LVM

## 2022-05-09 ENCOUNTER — Other Ambulatory Visit: Payer: Self-pay | Admitting: Cardiovascular Disease

## 2022-05-09 ENCOUNTER — Encounter: Payer: Self-pay | Admitting: Medical

## 2022-05-09 ENCOUNTER — Ambulatory Visit: Payer: Commercial Managed Care - PPO | Attending: Medical | Admitting: Medical

## 2022-05-09 VITALS — BP 142/88 | HR 72 | Ht 62.0 in | Wt 179.6 lb

## 2022-05-09 DIAGNOSIS — E782 Mixed hyperlipidemia: Secondary | ICD-10-CM | POA: Diagnosis not present

## 2022-05-09 DIAGNOSIS — I5032 Chronic diastolic (congestive) heart failure: Secondary | ICD-10-CM | POA: Diagnosis not present

## 2022-05-09 DIAGNOSIS — R0600 Dyspnea, unspecified: Secondary | ICD-10-CM | POA: Diagnosis not present

## 2022-05-09 DIAGNOSIS — R0609 Other forms of dyspnea: Secondary | ICD-10-CM

## 2022-05-09 DIAGNOSIS — I10 Essential (primary) hypertension: Secondary | ICD-10-CM

## 2022-05-09 DIAGNOSIS — I2510 Atherosclerotic heart disease of native coronary artery without angina pectoris: Secondary | ICD-10-CM | POA: Diagnosis not present

## 2022-05-09 MED ORDER — CARVEDILOL 6.25 MG PO TABS: 6.2500 mg | ORAL_TABLET | Freq: Two times a day (BID) | ORAL | 3 refills | Status: DC

## 2022-05-09 NOTE — Progress Notes (Signed)
Cardiology Office Note:    Date:  05/09/2022   ID:  Avriel Kandel, DOB 04/01/1956, MRN [id number]  PCP:  Jonetta Osgood, NP  [facility] Cardiologist:  None  [facility] Electrophysiologist:  None   Referring MD: Jonetta Osgood, NP   Chief Complaint: 1 year follow-up  History of Present Illness:    Brenda Guerrero is a 67 y.o. female with a hx of CAD status post anterior STEMI in 2017 status post stenting to the LAD, obesity, hypertension, hyperlipidemia, diabetes, asthma who presents for 1 year follow-up.  Patient had a STEMI in February 2017.  Cath showed single-vessel obstructive CAD, treated with DES to the LAD.  30 minutes on arrival to the ICU the patient had marked increased chest pain and hyperacute ST elevation consistent with reocclusion.  Patient was taken back to the Cath Lab and found to have reocclusion of the vessel.  She underwent repeat stenting of the LAD and PTCA for acute stent thrombosis.  Echo at that time showed normal EF.  Patient was last seen in January 2023 and was overall doing well from a cardiac perspective.  Today, the patient reports she is back working at [employer] since August. She is having increased allergies since she is sitting near the front door. She is having shortness of breath on exertion that is most noticeable when she walks to her car at the end of the day. She also reports wheezing. She denies chest pain, lower leg edema, orthopnea or pnd. BP is mildly elevated today. She is trying to stay active with walking. She has an appointment with PCP next month.   Past Medical History:  Diagnosis Date   Asthma    CAD in native artery    Diabetes mellitus without complication (HCC)    Hyperlipidemia LDL goal <70    Hypertension    ST elevation (STEMI) myocardial infarction involving left anterior descending coronary artery ([place]) 05/28/15   with DES to LAD and return to cath lab for restenosis requiring PTCA    Past Surgical  History:  Procedure Laterality Date   CARDIAC CATHETERIZATION N/A 05/28/2015   Procedure: Left Heart Cath and Coronary Angiography;  Surgeon: Peter M Martinique, MD;  Location: [place] [facility];  Service: Cardiovascular;  Laterality: N/A;   CARDIAC CATHETERIZATION N/A 05/28/2015   Procedure: Coronary Stent Intervention;  Surgeon: Peter M Martinique, MD;  Location: [place] [facility];  Service: Cardiovascular;  Laterality: N/A;   CARDIAC CATHETERIZATION N/A 05/28/2015   Procedure: Coronary Stent Intervention;  Surgeon: Peter M Martinique, MD;  Location: [place] [facility];  Service: Cardiovascular;  Laterality: N/A;   CARDIAC CATHETERIZATION N/A 05/28/2015   Procedure: Coronary/Graft Angiography;  Surgeon: Peter M Martinique, MD;  Location: [place] [facility];  Service: Cardiovascular;  Laterality: N/A;   CARDIAC CATHETERIZATION N/A 05/28/2015   Procedure: Intravascular Ultrasound/IVUS;  Surgeon: Peter M Martinique, MD;  Location: [place] [facility];  Service: Cardiovascular;  Laterality: N/A;   TUBAL LIGATION      Current Medications: Current Meds  Medication Sig   alendronate (FOSAMAX) 70 MG tablet TAKE 1 TABLET BY MOUTH ONCE WEEKLY FOR OSTEOPENIA   aspirin 81 MG chewable tablet Chew 1 tablet (81 mg total) by mouth daily.   calcium carbonate (OS-CAL) 1250 (500 Ca) MG chewable tablet Chew 1 tablet by mouth daily.   carvedilol (COREG) 6.25 MG tablet Take 1 tablet (6.25 mg total) by mouth 2 (two) times daily.   cholecalciferol (VITAMIN D) 1000 units  tablet Take 1,000 Units by mouth daily.   Cranberry 400 MG CAPS Take by mouth.   dapagliflozin propanediol (FARXIGA) 5 MG TABS tablet TAKE 1 TAB BY MOUTH DAILY BEFORE BREAKFAST.   fluticasone (FLONASE) 50 MCG/ACT nasal spray Place 1 spray into both nostrils daily.   ibuprofen (ADVIL,MOTRIN) 800 MG tablet Take 800 mg by mouth every 6 (six) hours as needed.    Multiple Vitamin (MULTI-VITAMINS) TABS Take by mouth.   nitroGLYCERIN (NITROSTAT) 0.4 MG SL tablet Place 1  tablet (0.4 mg total) under the tongue every 5 (five) minutes as needed for chest pain.   Omega-3 Fatty Acids (FISH OIL) 1000 MG CPDR Take 2,000 mg by mouth daily.   [DISCONTINUED] carvedilol (COREG) 3.125 MG tablet TAKE 1 TABLET BY MOUTH TWICE A DAY WITH MEALS   [DISCONTINUED] rosuvastatin (CRESTOR) 40 MG tablet Take 1 tablet (40 mg total) by mouth daily. PLEASE SCHEDULE OFFICE VISIT FOR FURTHER REFILLS. THANK YOU! 2ND ATTEMPT     Allergies:   Sulfamethoxazole   Social History   Socioeconomic History   Marital status: Married    Spouse name: Not on file   Number of children: Not on file   Years of education: Not on file   Highest education level: Not on file  Occupational History   Not on file  Tobacco Use   Smoking status: Former   Smokeless tobacco: Never   Tobacco comments:    about 40 years ago reported on 03/06/2019  Substance and Sexual Activity   Alcohol use: No   Drug use: No   Sexual activity: Yes    Birth control/protection: Post-menopausal  Other Topics Concern   Not on file  Social History Narrative   Not on file   Social Determinants of Health   Financial Resource Strain: Not on file  Food Insecurity: Not on file  Transportation Needs: Not on file  Physical Activity: Not on file  Stress: Not on file  Social Connections: Not on file     Family History: The patient's family history includes Heart attack (age of onset: 44) in her brother; Heart disease in her father. There is no history of Breast cancer.  ROS:   Please see the history of present illness.     All other systems reviewed and are negative.  EKGs/Labs/Other Studies Reviewed:    The following studies were reviewed today:  Echo 2017 Study Conclusions   - Left ventricle: The cavity size was normal. Wall thickness was    normal. Systolic function was normal. The estimated ejection    fraction was in the range of 55% to 60%. Wall motion was normal;    there were no regional wall motion  abnormalities. Doppler    parameters are consistent with abnormal left ventricular    relaxation (grade 1 diastolic dysfunction).   Impressions:   - Normal LV systolic function; grade 1 diastolic dysfunction; trace   Heart cath 2017 Prox RCA to Mid RCA lesion, 10% stenosed. The left ventricular systolic function is normal. Ost LAD to Prox LAD lesion, 100% stenosed. Post intervention, there is a 0% residual stenosis.   1. Single vessel obstructive CAD 2. Normal LV function 3. Successful stenting of the proximal LAD with DES.   Plan: DAPT for one year. Risk factor modification. Patient may be a candidate for fast track DC if no complications.   MR and TR.   Heart cath 2017 coronary stent intervention Prox LAD to Mid LAD lesion, 100% stenosed. Post intervention, there is  a 0% residual stenosis. The lesion was previously treated with a drug-eluting stent less than forty-eight hours ago.   Successful repeat stenting of the LAD and PTCA for acute stent thrombosis.    Plan: will continue IV heparin overnight with femoral sheath in place. Plan to DC heparin in am and remove sheath. Will continue IV Aggrastat for 18 hours. Otherwise continue treatment as previously outlined.    EKG:  EKG is ordered today.  The ekg ordered today demonstrates NSR 72bm, TWI aVL  Recent Labs: No results found for requested labs within last 365 days.  Recent Lipid Panel    Component Value Date/Time   CHOL 114 03/09/2021 0816   CHOL 145 03/15/2012 1036   TRIG 130 03/09/2021 0816   TRIG 209 (H) 03/15/2012 1036   HDL 29 (L) 03/09/2021 0816   HDL 23 (L) 03/15/2012 1036   CHOLHDL 6.1 12/28/2017 0759   VLDL 39 12/28/2017 0759   VLDL 42 (H) 03/15/2012 1036   LDLCALC 62 03/09/2021 0816   LDLCALC 80 03/15/2012 1036     Physical Exam:    VS:  BP (!) 142/88 (BP Location: Left Arm, Patient Position: Sitting, Cuff Size: Normal)   Pulse 72   Ht '5\' 2"'$  (1.575 m)   Wt 179 lb 9.6 oz (81.5 kg)   SpO2 98%   BMI  32.85 kg/m     Wt Readings from Last 3 Encounters:  05/09/22 179 lb 9.6 oz (81.5 kg)  05/02/21 172 lb 6 oz (78.2 kg)  11/04/20 172 lb (78 kg)     GEN:  Well nourished, well developed in no acute distress HEENT: Normal NECK: No JVD; No carotid bruits LYMPHATICS: No lymphadenopathy CARDIAC: RRR, no murmurs, rubs, gallops RESPIRATORY:  Clear to auscultation without rales, wheezing or rhonchi  ABDOMEN: Soft, non-tender, non-distended MUSCULOSKELETAL:  No edema; No deformity  SKIN: Warm and dry NEUROLOGIC:  Alert and oriented x 3 PSYCHIATRIC:  Normal affect   ASSESSMENT:    1. Coronary artery disease involving native coronary artery of native heart without angina pectoris   2. Hyperlipidemia, mixed   3. Dyspnea on exertion   4. Chronic diastolic heart failure ([place])   5. Essential hypertension    PLAN:    In order of problems listed above:  CAD 2017, patient had a STEMI with stenting to the LAD with subsequent ISR and restenting. No ischemic work-up since that time. Patient denies chest pain. She does reported DOE, for which we will order an echo. Continue ASA, Crestor and Coreg.   DOE Allergies/asthma Patient reports worsening DOE for the last 4-5 months, which is definitely exacerbated by worsening allergies/asthma. Patient appears euvolemic on exam. Prior echo in 2017 showed normal LVEF with G1DD. We agreed to repeat an echocardiogram today. Continue Coreg and [place].   HLD LDL 43 in 11/2021. Continue Crestor '40mg'$  daily.   HTN BP mildly elevated today. I will increase Coreg 6.'25mg'$ BID. We will re-evaluate BP at follow-up.  DM2 Most recent A1C 7.2. Patient is working on lifestyle changes. Continue follow-up with PCP.   Disposition: Follow up in 1-2 month(s) with MD/APP     Signed, Yaeli Hartung Ninfa Meeker, PA-C  05/09/2022 10:04 AM    [place]

## 2022-05-09 NOTE — Patient Instructions (Signed)
Medication Instructions:  Your physician has recommended you make the following change in your medication:  Increase Coreg to 6.25 mg  Take 1 tablet by mouth twice daily.  *If you need a refill on your cardiac medications before your next appointment, please call your pharmacy*   Lab Work: None ordered If you have labs (blood work) drawn today and your tests are completely normal, you will receive your results only by: [place] (if you have MyChart) OR A paper copy in the mail If you have any lab test that is abnormal or we need to change your treatment, we will call you to review the results.   Testing/Procedures: Your physician has requested that you have an echocardiogram. Echocardiography is a painless test that uses sound waves to create images of your heart. It provides your doctor with information about the size and shape of your heart and how well your heart's chambers and valves are working. This procedure takes approximately one hour. There are no restrictions for this procedure. There is a possibility that an IV may need to be started during your test to inject an image enhancing agent. This is done to obtain more optimal pictures of your heart. Therefore we ask that you do at least drink some water prior to coming in to hydrate your veins.   Please do NOT wear cologne, perfume, aftershave, or lotions (deodorant is allowed). Please arrive 15 minutes prior to your appointment time.    Follow-Up: At [facility], you and your health needs are our priority.  As part of our continuing mission to provide you with exceptional heart care, we have created designated Provider Care Teams.  These Care Teams include your primary Cardiologist (physician) and Advanced Practice Providers (APPs -  Physician Assistants and Nurse Practitioners) who all work together to provide you with the care you need, when you need it.  We recommend signing up for the patient portal called  "MyChart".  Sign up information is provided on this After Visit Summary.  MyChart is used to connect with patients for Virtual Visits (Telemedicine).  Patients are able to view lab/test results, encounter notes, upcoming appointments, etc.  Non-urgent messages can be sent to your provider as well.   To learn more about what you can do with MyChart, go to [email or web address].    Your next appointment:   1-2 month(s)  Provider:   You may see Ida Rogue, MD or one of the following Advanced Practice Providers on your designated Care Team:   Cadence Kathlen Mody, [place]  Other Instructions Echocardiogram An echocardiogram is a test that uses sound waves (ultrasound) to produce images of the heart. Images from an echocardiogram can provide important information about: Heart size and shape. The size and thickness and movement of your heart's walls. Heart muscle function and strength. Heart valve function or if you have stenosis. Stenosis is when the heart valves are too narrow. If blood is flowing backward through the heart valves (regurgitation). A tumor or infectious growth around the heart valves. Areas of heart muscle that are not working well because of poor blood flow or injury from a heart attack. Aneurysm detection. An aneurysm is a weak or damaged part of an artery wall. The wall bulges out from the normal force of blood pumping through the body. Tell a health care provider about: Any allergies you have. All medicines you are taking, including vitamins, herbs, eye drops, creams, and over-the-counter medicines. Any blood disorders you have. Any surgeries you have  had. Any medical conditions you have. Whether you are pregnant or may be pregnant. What are the risks? Generally, this is a safe test. However, problems may occur, including an allergic reaction to dye (contrast) that may be used during the test. What happens before the test? No specific preparation is needed. You may eat and  drink normally. What happens during the test?  You will take off your clothes from the waist up and put on a hospital gown. Electrodes or electrocardiogram (ECG)patches may be placed on your chest. The electrodes or patches are then connected to a device that monitors your heart rate and rhythm. You will lie down on a table for an ultrasound exam. A gel will be applied to your chest to help sound waves pass through your skin. A handheld device, called a transducer, will be pressed against your chest and moved over your heart. The transducer produces sound waves that travel to your heart and bounce back (or "echo" back) to the transducer. These sound waves will be captured in real-time and changed into images of your heart that can be viewed on a video monitor. The images will be recorded on a computer and reviewed by your health care provider. You may be asked to change positions or hold your breath for a short time. This makes it easier to get different views or better views of your heart. In some cases, you may receive contrast through an IV in one of your veins. This can improve the quality of the pictures from your heart. The procedure may vary among health care providers and hospitals. What can I expect after the test? You may return to your normal, everyday life, including diet, activities, and medicines, unless your health care provider tells you not to do that. Follow these instructions at home: It is up to you to get the results of your test. Ask your health care provider, or the department that is doing the test, when your results will be ready. Keep all follow-up visits. This is important. Summary An echocardiogram is a test that uses sound waves (ultrasound) to produce images of the heart. Images from an echocardiogram can provide important information about the size and shape of your heart, heart muscle function, heart valve function, and other possible heart problems. You do not need  to do anything to prepare before this test. You may eat and drink normally. After the echocardiogram is completed, you may return to your normal, everyday life, unless your health care provider tells you not to do that. This information is not intended to replace advice given to you by your health care provider. Make sure you discuss any questions you have with your health care provider. Document Revised: 12/15/2020 Document Reviewed: 11/25/2019 Elsevier Patient Education  [place].

## 2022-06-06 ENCOUNTER — Other Ambulatory Visit: Payer: Self-pay

## 2022-06-06 DIAGNOSIS — R748 Abnormal levels of other serum enzymes: Secondary | ICD-10-CM

## 2022-06-28 ENCOUNTER — Ambulatory Visit: Payer: Commercial Managed Care - PPO | Attending: Medical

## 2022-06-28 DIAGNOSIS — R0609 Other forms of dyspnea: Secondary | ICD-10-CM

## 2022-06-28 DIAGNOSIS — R0600 Dyspnea, unspecified: Secondary | ICD-10-CM | POA: Diagnosis not present

## 2022-06-28 LAB — ECHOCARDIOGRAM COMPLETE
AR max vel: 1.94 cm2
AV Area VTI: 1.77 cm2
AV Area mean vel: 2.02 cm2
AV Mean grad: 5 mmHg
AV Peak grad: 9.1 mmHg
Ao pk vel: 1.51 m/s
Area-P 1/2: 0.99 cm2
Calc EF: 58.3 %
S' Lateral: 2.5 cm
Single Plane A2C EF: 66.1 %
Single Plane A4C EF: 49 %

## 2022-07-10 NOTE — Progress Notes (Unsigned)
Date:  07/11/2022   ID:  Brenda Guerrero, DOB [birth year], MRN [id number]  Patient Location:  [address] [place] [address]   Provider location:   [facility], [place] office  PCP:  Jonetta Osgood, NP  Cardiologist:  None   Chief Complaint  Patient presents with   Follow-up    Patient c/o some discomfort in chest, feels she pulled a muscle from vomiting after using Ozempic for 1 week. Medications reviewed by the patient verbally.     History of Present Illness:    Brenda Guerrero is a 67 y.o. female  past medical history of CAD. 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    Who presents for follow-up of her coronary disease, prior STEMI  Last seen by myself in clinic January 2000 Tried ozempic 0.25, side effects after taking the injection, Nausea/vomiting, diarrhea Atypical chest pain after vomiting,   Good exercise tolerance, but does not do regular exercise program  no shortness of breath and chest pain typically on exertion No PND, orthopnea, no leg swelling  Troubled by high sugars over the past year or so, A1c used to be in the 6 range now running low 8 range  Lab work reviewed A1C 8.3 Total chol 127, LDL 39 Tolerating Crestor 40  Works at [employer]  EKG from January 2024 reviewed  Other past medical history reviewed  [facility] on 05/28/15 as an anterior STEMI.   cath  single vessel obstructive CAD, DES to LAD.   Pk troponin 4.  30 minutes of arrival in the ICU the patient had marked increase in chest pain and hyperacute ST elevation consistent with reocclusion.  taken back to the cath lab and found to have reocclusion of the vessel.  She underwent repeat stenting of the LAD and PTCA for acute stent thrombosis.  EF normal by Echo   brother has an LVAD ischemic cardiomyopathy    - Left ventricle: The cavity size  was normal. Wall thickness was   normal. Systolic function was normal. The estimated ejection   fraction was in the range of 55% to 60%. Wall motion was normal;   there were no regional wall motion abnormalities. Doppler   parameters are consistent with abnormal left ventricular   relaxation (grade 1 diastolic dysfunction).   - Normal LV systolic function; grade 1 diastolic dysfunction; trace   MR and TR.   Past Medical History:  Diagnosis Date   Asthma    CAD in native artery    Diabetes mellitus without complication (HCC)    Hyperlipidemia LDL goal <70    Hypertension    ST elevation (STEMI) myocardial infarction involving left anterior descending coronary artery ([place]) 05/28/15   with DES to LAD and return to cath lab for restenosis requiring PTCA   Past Surgical History:  Procedure Laterality Date   CARDIAC CATHETERIZATION N/A 05/28/2015   Procedure: Left Heart Cath and Coronary Angiography;  Surgeon: Peter M Martinique, MD;  Location: [place] [facility] LAB;  Service: Cardiovascular;  Laterality: N/A;   CARDIAC CATHETERIZATION N/A 05/28/2015   Procedure: Coronary Stent Intervention;  Surgeon: Peter M Martinique, MD;  Location: [place] CV LAB;  Service: Cardiovascular;  Laterality: N/A;   CARDIAC CATHETERIZATION N/A 05/28/2015   Procedure: Coronary Stent Intervention;  Surgeon: Peter M Martinique, MD;  Location: [place] CV LAB;  Service:  Cardiovascular;  Laterality: N/A;   CARDIAC CATHETERIZATION N/A 05/28/2015   Procedure: Coronary/Graft Angiography;  Surgeon: Peter M Martinique, MD;  Location: [place] [facility] LAB;  Service: Cardiovascular;  Laterality: N/A;   CARDIAC CATHETERIZATION N/A 05/28/2015   Procedure: Intravascular Ultrasound/IVUS;  Surgeon: Peter M Martinique, MD;  Location: [place] [facility] LAB;  Service: Cardiovascular;  Laterality: N/A;   TUBAL LIGATION       Current Meds  Medication Sig   albuterol (VENTOLIN HFA) 108 (90 Base) MCG/ACT inhaler Inhale 1-2 puffs into the lungs every 4  (four) hours as needed.   alendronate (FOSAMAX) 70 MG tablet TAKE 1 TABLET BY MOUTH ONCE WEEKLY FOR OSTEOPENIA   aspirin 81 MG chewable tablet Chew 1 tablet (81 mg total) by mouth daily.   calcium carbonate (OS-CAL) 1250 (500 Ca) MG chewable tablet Chew 1 tablet by mouth daily.   carvedilol (COREG) 6.25 MG tablet Take 1 tablet (6.25 mg total) by mouth 2 (two) times daily.   cholecalciferol (VITAMIN D) 1000 units tablet Take 1,000 Units by mouth daily.   Cranberry 400 MG CAPS Take by mouth.   dapagliflozin propanediol (FARXIGA) 5 MG TABS tablet TAKE 1 TAB BY MOUTH DAILY BEFORE BREAKFAST.   fexofenadine (ALLEGRA) 180 MG tablet Take 180 mg by mouth daily.   fluticasone (FLONASE) 50 MCG/ACT nasal spray Place 1 spray into both nostrils daily.   ibuprofen (ADVIL,MOTRIN) 800 MG tablet Take 800 mg by mouth every 6 (six) hours as needed.    montelukast (SINGULAIR) 10 MG tablet Take 1 tablet by mouth at bedtime.   Multiple Vitamin (MULTI-VITAMINS) TABS Take by mouth.   nitroGLYCERIN (NITROSTAT) 0.4 MG SL tablet Place 1 tablet (0.4 mg total) under the tongue every 5 (five) minutes as needed for chest pain.   Omega-3 Fatty Acids (FISH OIL) 1000 MG CPDR Take 2,000 mg by mouth daily.   rosuvastatin (CRESTOR) 40 MG tablet TAKE 1 TABLET (40 MG TOTAL) BY MOUTH DAILY. PLEASE SCHEDULE OFFICE VISIT FOR FURTHER REFILLS.     Allergies:   Sulfamethoxazole   Social History   Tobacco Use   Smoking status: Former   Smokeless tobacco: Never   Tobacco comments:    about 40 years ago reported on 03/06/2019  Vaping Use   Vaping Use: Never used  Substance Use Topics   Alcohol use: No   Drug use: No     Current Outpatient Medications on File Prior to Visit  Medication Sig Dispense Refill   albuterol (VENTOLIN HFA) 108 (90 Base) MCG/ACT inhaler Inhale 1-2 puffs into the lungs every 4 (four) hours as needed.     alendronate (FOSAMAX) 70 MG tablet TAKE 1 TABLET BY MOUTH ONCE WEEKLY FOR OSTEOPENIA 12 tablet 4    aspirin 81 MG chewable tablet Chew 1 tablet (81 mg total) by mouth daily.     calcium carbonate (OS-CAL) 1250 (500 Ca) MG chewable tablet Chew 1 tablet by mouth daily.     carvedilol (COREG) 6.25 MG tablet Take 1 tablet (6.25 mg total) by mouth 2 (two) times daily. 180 tablet 3   cholecalciferol (VITAMIN D) 1000 units tablet Take 1,000 Units by mouth daily.     Cranberry 400 MG CAPS Take by mouth.     dapagliflozin propanediol (FARXIGA) 5 MG TABS tablet TAKE 1 TAB BY MOUTH DAILY BEFORE BREAKFAST. 90 tablet 0   fexofenadine (ALLEGRA) 180 MG tablet Take 180 mg by mouth daily.     fluticasone (FLONASE) 50 MCG/ACT nasal spray Place 1 spray into  both nostrils daily. 48 g 5   ibuprofen (ADVIL,MOTRIN) 800 MG tablet Take 800 mg by mouth every 6 (six) hours as needed.      montelukast (SINGULAIR) 10 MG tablet Take 1 tablet by mouth at bedtime.     Multiple Vitamin (MULTI-VITAMINS) TABS Take by mouth.     nitroGLYCERIN (NITROSTAT) 0.4 MG SL tablet Place 1 tablet (0.4 mg total) under the tongue every 5 (five) minutes as needed for chest pain. 25 tablet 2   Omega-3 Fatty Acids (FISH OIL) 1000 MG CPDR Take 2,000 mg by mouth daily.     rosuvastatin (CRESTOR) 40 MG tablet TAKE 1 TABLET (40 MG TOTAL) BY MOUTH DAILY. PLEASE SCHEDULE OFFICE VISIT FOR FURTHER REFILLS. 30 tablet 0   azithromycin (ZITHROMAX) 250 MG tablet Take one tab a day for 10 days for uri (Patient not taking: Reported on 05/02/2021) 10 tablet 0   benzonatate (TESSALON) 200 MG capsule Take by mouth. (Patient not taking: Reported on 05/02/2021)     chlorpheniramine-HYDROcodone (TUSSIONEX) 10-8 MG/5ML SUER Take by mouth. (Patient not taking: Reported on 05/09/2022)     doxycycline (VIBRA-TABS) 100 MG tablet Take 1 tablet (100 mg total) by mouth 2 (two) times daily. With food (Patient not taking: Reported on 05/02/2021) 14 tablet 0   predniSONE (DELTASONE) 20 MG tablet Take 40 mg by mouth daily. (Patient not taking: Reported on 05/02/2021)     No current  facility-administered medications on file prior to visit.     Family Hx: The patient's family history includes Heart attack (age of onset: 85) in her brother; Heart disease in her father. There is no history of Breast cancer.  ROS:   Please see the history of present illness.    Review of Systems  Constitutional: Negative.   Respiratory: Negative.    Cardiovascular: Negative.   Gastrointestinal: Negative.   Musculoskeletal: Negative.   Neurological: Negative.   Psychiatric/Behavioral: Negative.    All other systems reviewed and are negative.    Labs/Other Tests and Data Reviewed:    Recent Labs: No results found for requested labs within last 365 days.   Recent Lipid Panel Lab Results  Component Value Date/Time   CHOL 114 03/09/2021 08:16 AM   CHOL 145 03/15/2012 10:36 AM   TRIG 130 03/09/2021 08:16 AM   TRIG 209 (H) 03/15/2012 10:36 AM   HDL 29 (L) 03/09/2021 08:16 AM   HDL 23 (L) 03/15/2012 10:36 AM   CHOLHDL 6.1 12/28/2017 07:59 AM   LDLCALC 62 03/09/2021 08:16 AM   LDLCALC 80 03/15/2012 10:36 AM    Wt Readings from Last 3 Encounters:  07/11/22 174 lb 8 oz (79.2 kg)  05/09/22 179 lb 9.6 oz (81.5 kg)  05/02/21 172 lb 6 oz (78.2 kg)     Exam:    Vital Signs:  BP 116/72 (BP Location: Left Arm, Patient Position: Sitting, Cuff Size: Normal)   Pulse 67   Ht 5' 2.5" (1.588 m)   Wt 174 lb 8 oz (79.2 kg)   SpO2 98%   BMI 31.41 kg/m   Constitutional:  oriented to person, place, and time. No distress.  HENT:  Head: Grossly normal Eyes:  no discharge. No scleral icterus.  Neck: No JVD, no carotid bruits  Cardiovascular: Regular rate and rhythm, no murmurs appreciated Pulmonary/Chest: Clear to auscultation bilaterally, no wheezes or rails Abdominal: Soft.  no distension.  no tenderness.  Musculoskeletal: Normal range of motion Neurological:  normal muscle tone. Coordination normal. No atrophy Skin: Skin warm  and dry Psychiatric: normal affect,  pleasant  ASSESSMENT & PLAN:    Atherosclerosis of native coronary artery of native heart with stable angina pectoris (HCC) Currently with no symptoms of angina. No further workup at this time. Continue current medication regimen.  Atypical chest pain from vomiting after shot of Ozempic  Dyslipidemia Cholesterol is at goal on the current lipid regimen. No changes to the medications were made.  Essential hypertension Blood pressure is well controlled on today's visit. No changes made to the medications.  Diabetes type II We have encouraged continued exercise, careful diet management in an effort to lose weight.  Not tolerate Ozempic  Recovering from COVID December 2022,  recovered    Total encounter time more than 30 minutes  Greater than 50% was spent in counseling and coordination of care with the patient    Signed, Ida Rogue, MD  07/11/2022 8:24 AM    [facility] [address], [place], [place] [address]

## 2022-07-11 ENCOUNTER — Encounter: Payer: Self-pay | Admitting: Cardiovascular Disease

## 2022-07-11 ENCOUNTER — Ambulatory Visit: Payer: Commercial Managed Care - PPO | Attending: Cardiovascular Disease | Admitting: Cardiovascular Disease

## 2022-07-11 VITALS — BP 116/72 | HR 67 | Ht 62.5 in | Wt 174.5 lb

## 2022-07-11 DIAGNOSIS — E785 Hyperlipidemia, unspecified: Secondary | ICD-10-CM

## 2022-07-11 DIAGNOSIS — I10 Essential (primary) hypertension: Secondary | ICD-10-CM | POA: Diagnosis not present

## 2022-07-11 DIAGNOSIS — E1165 Type 2 diabetes mellitus with hyperglycemia: Secondary | ICD-10-CM | POA: Diagnosis not present

## 2022-07-11 DIAGNOSIS — I5032 Chronic diastolic (congestive) heart failure: Secondary | ICD-10-CM | POA: Diagnosis not present

## 2022-07-11 DIAGNOSIS — I25118 Atherosclerotic heart disease of native coronary artery with other forms of angina pectoris: Secondary | ICD-10-CM

## 2022-07-11 MED ORDER — CARVEDILOL 6.25 MG PO TABS: 6.2500 mg | ORAL_TABLET | Freq: Two times a day (BID) | ORAL | 3 refills | Status: DC

## 2022-07-11 MED ORDER — ROSUVASTATIN CALCIUM 40 MG PO TABS: ORAL_TABLET | ORAL | 0 refills | Status: DC

## 2022-07-11 NOTE — Patient Instructions (Signed)
Medication Instructions:  No changes  If you need a refill on your cardiac medications before your next appointment, please call your pharmacy.   Lab work: No new labs needed  Testing/Procedures: No new testing needed  Follow-Up: At [facility], you and your health needs are our priority.  As part of our continuing mission to provide you with exceptional heart care, we have created designated Provider Care Teams.  These Care Teams include your primary Cardiologist (physician) and Advanced Practice Providers (APPs -  Physician Assistants and Nurse Practitioners) who all work together to provide you with the care you need, when you need it.  You will need a follow up appointment in 12 months  Providers on your designated Care Team:   Christopher Berge, NP Ryan Dunn, PA-C Cadence Furth, PA-C  COVID-19 Vaccine Information can be found at: [email or web address] For questions related to vaccine distribution or appointments, please email [email or web address] or call [phone].   

## 2022-07-27 ENCOUNTER — Encounter: Payer: Self-pay | Admitting: Family Medicine

## 2022-07-27 DIAGNOSIS — R748 Abnormal levels of other serum enzymes: Secondary | ICD-10-CM

## 2022-10-31 ENCOUNTER — Other Ambulatory Visit: Payer: Self-pay | Admitting: Family Medicine

## 2022-10-31 DIAGNOSIS — R748 Abnormal levels of other serum enzymes: Secondary | ICD-10-CM

## 2022-11-01 ENCOUNTER — Ambulatory Visit
Admission: RE | Admit: 2022-11-01 | Discharge: 2022-11-01 | Disposition: A | Discharge status: Home / Self Care | Payer: Commercial Managed Care - PPO | Source: Ambulatory Visit | Attending: Family Medicine | Admitting: Family Medicine

## 2022-11-01 DIAGNOSIS — R748 Abnormal levels of other serum enzymes: Secondary | ICD-10-CM

## 2022-11-02 ENCOUNTER — Other Ambulatory Visit: Payer: Commercial Managed Care - PPO

## 2023-01-26 ENCOUNTER — Encounter: Payer: Self-pay | Admitting: *Deleted

## 2023-02-02 ENCOUNTER — Encounter
Admission: RE | Disposition: A | Discharge status: Home / Self Care | Payer: Self-pay | Source: Home / Self Care | Attending: Gastroenterology

## 2023-02-02 ENCOUNTER — Ambulatory Visit: Payer: Commercial Managed Care - PPO | Admitting: Certified Registered"

## 2023-02-02 ENCOUNTER — Ambulatory Visit
Admission: RE | Admit: 2023-02-02 | Discharge: 2023-02-02 | Disposition: A | Discharge status: Home / Self Care | Payer: Commercial Managed Care - PPO | Attending: Gastroenterology | Admitting: Gastroenterology

## 2023-02-02 ENCOUNTER — Encounter: Payer: Self-pay | Admitting: Gastroenterology

## 2023-02-02 DIAGNOSIS — Z7984 Long term (current) use of oral hypoglycemic drugs: Secondary | ICD-10-CM | POA: Diagnosis not present

## 2023-02-02 DIAGNOSIS — K529 Noninfective gastroenteritis and colitis, unspecified: Secondary | ICD-10-CM | POA: Insufficient documentation

## 2023-02-02 DIAGNOSIS — I10 Essential (primary) hypertension: Secondary | ICD-10-CM | POA: Diagnosis not present

## 2023-02-02 DIAGNOSIS — E669 Obesity, unspecified: Secondary | ICD-10-CM | POA: Diagnosis not present

## 2023-02-02 DIAGNOSIS — E785 Hyperlipidemia, unspecified: Secondary | ICD-10-CM | POA: Diagnosis not present

## 2023-02-02 DIAGNOSIS — Z6831 Body mass index (BMI) 31.0-31.9, adult: Secondary | ICD-10-CM | POA: Diagnosis not present

## 2023-02-02 DIAGNOSIS — J45909 Unspecified asthma, uncomplicated: Secondary | ICD-10-CM | POA: Insufficient documentation

## 2023-02-02 DIAGNOSIS — I2510 Atherosclerotic heart disease of native coronary artery without angina pectoris: Secondary | ICD-10-CM | POA: Diagnosis not present

## 2023-02-02 DIAGNOSIS — R194 Change in bowel habit: Secondary | ICD-10-CM | POA: Insufficient documentation

## 2023-02-02 DIAGNOSIS — Z87891 Personal history of nicotine dependence: Secondary | ICD-10-CM | POA: Diagnosis not present

## 2023-02-02 DIAGNOSIS — Z955 Presence of coronary angioplasty implant and graft: Secondary | ICD-10-CM | POA: Diagnosis not present

## 2023-02-02 DIAGNOSIS — I252 Old myocardial infarction: Secondary | ICD-10-CM | POA: Diagnosis not present

## 2023-02-02 DIAGNOSIS — K5730 Diverticulosis of large intestine without perforation or abscess without bleeding: Secondary | ICD-10-CM | POA: Diagnosis not present

## 2023-02-02 DIAGNOSIS — E119 Type 2 diabetes mellitus without complications: Secondary | ICD-10-CM | POA: Insufficient documentation

## 2023-02-02 DIAGNOSIS — K640 First degree hemorrhoids: Secondary | ICD-10-CM | POA: Diagnosis not present

## 2023-02-02 DIAGNOSIS — Z79899 Other long term (current) drug therapy: Secondary | ICD-10-CM | POA: Diagnosis not present

## 2023-02-02 HISTORY — PX: COLONOSCOPY: SHX5424

## 2023-02-02 HISTORY — PX: BIOPSY: SHX5522

## 2023-02-02 LAB — GLUCOSE, CAPILLARY: Glucose-Capillary: 117 mg/dL — ABNORMAL HIGH (ref 70–99)

## 2023-02-02 SURGERY — COLONOSCOPY
Anesthesia: General

## 2023-02-02 MED ORDER — PROPOFOL 1000 MG/100ML IV EMUL
INTRAVENOUS | Status: AC
  Filled 2023-02-02: qty 100

## 2023-02-02 MED ORDER — PROPOFOL 500 MG/50ML IV EMUL
INTRAVENOUS | Status: DC | PRN
  Administered 2023-02-02: 140 ug/kg/min via INTRAVENOUS
  Administered 2023-02-02: 80 mg via INTRAVENOUS

## 2023-02-02 MED ORDER — SODIUM CHLORIDE 0.9 % IV SOLN
INTRAVENOUS | Status: DC
  Administered 2023-02-02: 20 mL/h via INTRAVENOUS

## 2023-02-02 NOTE — Anesthesia Postprocedure Evaluation (Signed)
Anesthesia Post Note  Patient: Geoffrey Nedved  Procedure(s) Performed: COLONOSCOPY BIOPSY  Patient location during evaluation: PACU Anesthesia Type: General Level of consciousness: awake and alert Pain management: pain level controlled Vital Signs Assessment: post-procedure vital signs reviewed and stable Respiratory status: spontaneous breathing, nonlabored ventilation, respiratory function stable and patient connected to nasal cannula oxygen Cardiovascular status: blood pressure returned to baseline and stable Postop Assessment: no apparent nausea or vomiting Anesthetic complications: no   No notable events documented.   Last Vitals:  Vitals:   02/02/23 0838 02/02/23 0848  BP: 119/60 123/63  Pulse: 89 82  Resp: 18 18  Temp:    SpO2: 99% 99%    Last Pain:  Vitals:   02/02/23 0848  TempSrc:   PainSc: 0-No pain                 Yevette Edwards

## 2023-02-02 NOTE — Anesthesia Preprocedure Evaluation (Signed)
Anesthesia Evaluation  Patient identified by MRN, date of birth, ID band Patient awake    Reviewed: Allergy & Precautions, H&P , NPO status , Patient's Chart, lab work & pertinent test results, reviewed documented beta blocker date and time   Airway Mallampati: II   Neck ROM: full    Dental  (+) Poor Dentition   Pulmonary neg pulmonary ROS, asthma , former smoker   Pulmonary exam normal        Cardiovascular Exercise Tolerance: Good hypertension, On Medications + CAD and + Past MI  negative cardio ROS Normal cardiovascular exam Rhythm:regular Rate:Normal     Neuro/Psych negative neurological ROS  negative psych ROS   GI/Hepatic negative GI ROS, Neg liver ROS,,,  Endo/Other  negative endocrine ROSdiabetes, Well Controlled    Renal/GU negative Renal ROS  negative genitourinary   Musculoskeletal   Abdominal   Peds  Hematology negative hematology ROS (+)   Anesthesia Other Findings Past Medical History: No date: Asthma No date: CAD in native artery No date: Diabetes mellitus without complication (HCC) No date: Hyperlipidemia LDL goal <70 No date: Hypertension 05/28/15: ST elevation (STEMI) myocardial infarction involving left  anterior descending coronary artery (HCC)     Comment:  with DES to LAD and return to cath lab for restenosis               requiring PTCA Past Surgical History: 05/28/2015: CARDIAC CATHETERIZATION; N/A     Comment:  Procedure: Left Heart Cath and Coronary Angiography;                Surgeon: Peter M Swaziland, MD;  [facility] INVASIVE CV               LAB;  Service: Cardiovascular;  Laterality: N/A; 05/28/2015: CARDIAC CATHETERIZATION; N/A     Comment:  Procedure: Coronary Stent Intervention;  Surgeon: Peter               M Swaziland, MD;  Location: [facility] INVASIVE CV LAB;  Service:               Cardiovascular;  Laterality: N/A; 05/28/2015: CARDIAC CATHETERIZATION; N/A     Comment:  Procedure:  Coronary Stent Intervention;  Surgeon: Peter               M Swaziland, MD;  Location: [facility] INVASIVE CV LAB;  Service:               Cardiovascular;  Laterality: N/A; 05/28/2015: CARDIAC CATHETERIZATION; N/A     Comment:  Procedure: Coronary/Graft Angiography;  Surgeon: Peter M              Swaziland, MD;  Location: [facility] INVASIVE CV LAB;  Service:               Cardiovascular;  Laterality: N/A; 05/28/2015: CARDIAC CATHETERIZATION; N/A     Comment:  Procedure: Intravascular Ultrasound/IVUS;  Surgeon:               Peter M Swaziland, MD;  Location: [facility] INVASIVE CV LAB;                Service: Cardiovascular;  Laterality: N/A; No date: TUBAL LIGATION BMI    Body Mass Index: 31.39 kg/m     Reproductive/Obstetrics negative OB ROS                             Anesthesia Physical Anesthesia Plan  ASA: 3  Anesthesia Plan:  General   Post-op Pain Management:    Induction:   PONV Risk Score and Plan:   Airway Management Planned:   Additional Equipment:   Intra-op Plan:   Post-operative Plan:   Informed Consent: I have reviewed the patients History and Physical, chart, labs and discussed the procedure including the risks, benefits and alternatives for the proposed anesthesia with the patient or authorized representative who has indicated his/her understanding and acceptance.     Dental Advisory Given  Plan Discussed with: CRNA  Anesthesia Plan Comments:        Anesthesia Quick Evaluation

## 2023-02-02 NOTE — Op Note (Signed)
[facility] Gastroenterology Patient Name: Brenda Guerrero Procedure Date: 02/02/2023 7:12 AM MRN: [id number] Account #: [id number] Date of Birth: 06/17/1955 Admit Type: Outpatient Age: 67 Room: [facility] ENDO ROOM 3 Gender: Female Note Status: Finalized Instrument Name: Prentice Docker [id number] Procedure:             Colonoscopy Indications:           Change in bowel habits, Chronic diarrhea Providers:             Eather Colas MD, MD Referring MD:          Eather Colas MD, MD (Referring MD), Lisabeth Pick.                         Fields (Referring MD) Medicines:             Monitored Anesthesia Care Complications:         No immediate complications. Estimated blood loss:                         Minimal. Procedure:             Pre-Anesthesia Assessment:                        - Prior to the procedure, a History and Physical was                         performed, and patient medications and allergies were                         reviewed. The patient is competent. The risks and                         benefits of the procedure and the sedation options and                         risks were discussed with the patient. All questions                         were answered and informed consent was obtained.                         Patient identification and proposed procedure were                         verified by the physician, the nurse, the                         anesthesiologist, the anesthetist and the technician                         in the endoscopy suite. Mental Status Examination:                         alert and oriented. Airway Examination: normal                         oropharyngeal airway and neck mobility. Respiratory  Examination: clear to auscultation. CV Examination:                         normal. Prophylactic Antibiotics: The patient does not                         require prophylactic antibiotics. Prior                          Anticoagulants: The patient has taken no anticoagulant                         or antiplatelet agents. ASA Grade Assessment: III - A                         patient with severe systemic disease. After reviewing                         the risks and benefits, the patient was deemed in                         satisfactory condition to undergo the procedure. The                         anesthesia plan was to use monitored anesthesia care                         (MAC). Immediately prior to administration of                         medications, the patient was re-assessed for adequacy                         to receive sedatives. The heart rate, respiratory                         rate, oxygen saturations, blood pressure, adequacy of                         pulmonary ventilation, and response to care were                         monitored throughout the procedure. The physical                         status of the patient was re-assessed after the                         procedure.                        After obtaining informed consent, the colonoscope was                         passed under direct vision. Throughout the procedure,                         the patient's blood pressure, pulse, and oxygen  saturations were monitored continuously. The                         Colonoscope was introduced through the anus and                         advanced to the the terminal ileum. The colonoscopy                         was performed without difficulty. The patient                         tolerated the procedure well. The quality of the bowel                         preparation was good. The terminal ileum, ileocecal                         valve, appendiceal orifice, and rectum were                         photographed. Findings:      The perianal and digital rectal examinations were normal.      The terminal ileum appeared normal.      Normal mucosa was found in the  entire colon. Biopsies for histology were       taken with a cold forceps from the entire colon for evaluation of       microscopic colitis. Estimated blood loss was minimal.      A single small-mouthed diverticulum was found in the sigmoid colon.      Internal hemorrhoids were found during retroflexion. The hemorrhoids       were Grade I (internal hemorrhoids that do not prolapse).      The exam was otherwise without abnormality on direct and retroflexion       views. Impression:            - The examined portion of the ileum was normal.                        - Normal mucosa in the entire examined colon. Biopsied.                        - Diverticulosis in the sigmoid colon.                        - Internal hemorrhoids.                        - The examination was otherwise normal on direct and                         retroflexion views. Recommendation:        - Discharge patient to home.                        - Resume previous diet.                        - Continue present medications.                        -  Await pathology results.                        - Repeat colonoscopy in 10 years for screening                         purposes.                        - Return to referring physician as previously                         scheduled. Procedure Code(s):     --- Professional ---                        [id number], Colonoscopy, flexible; with biopsy, single or                         multiple Diagnosis Code(s):     --- Professional ---                        K64.0, First degree hemorrhoids                        R19.4, Change in bowel habit                        K52.9, Noninfective gastroenteritis and colitis,                         unspecified                        K57.30, Diverticulosis of large intestine without                         perforation or abscess without bleeding CPT copyright 2022 American Medical Association. All rights reserved. The codes documented in this  report are preliminary and upon coder review may  be revised to meet current compliance requirements. Eather Colas MD, MD 02/02/2023 8:34:06 AM Number of Addenda: 0 Note Initiated On: 02/02/2023 7:12 AM Scope Withdrawal Time: 0 hours 7 minutes 37 seconds  Total Procedure Duration: 0 hours 9 minutes 58 seconds  Estimated Blood Loss:  Estimated blood loss was minimal.      [facility]

## 2023-02-02 NOTE — Interval H&P Note (Signed)
History and Physical Interval Note:  02/02/2023 8:09 AM  Brenda Guerrero  has presented today for surgery, with the diagnosis of bowel habit changes.  The various methods of treatment have been discussed with the patient and family. After consideration of risks, benefits and other options for treatment, the patient has consented to  Procedure(s) with comments: COLONOSCOPY (N/A) - 1ST CASE, PLEASE - WORKS IN ADMITTING as a surgical intervention.  The patient's history has been reviewed, patient examined, no change in status, stable for surgery.  I have reviewed the patient's chart and labs.  Questions were answered to the patient's satisfaction.     Regis Bill  Ok to proceed with colonoscopy

## 2023-02-02 NOTE — Transfer of Care (Signed)
Immediate Anesthesia Transfer of Care Note  Patient: Brenda Guerrero  Procedure(s) Performed: COLONOSCOPY BIOPSY  Patient Location: PACU  Anesthesia Type:General  Level of Consciousness: awake and alert   Airway & Oxygen Therapy: Patient Spontanous Breathing and Patient connected to nasal cannula oxygen  Post-op Assessment: Report given to RN and Post -op Vital signs reviewed and stable  Post vital signs: Reviewed and stable  Last Vitals:  Vitals Value Taken Time  BP 119/58 02/02/23 0829  Temp    Pulse 89 02/02/23 0831  Resp    SpO2 94 % 02/02/23 0831  Vitals shown include unfiled device data.  Last Pain:  Vitals:   02/02/23 0709  TempSrc: Temporal  PainSc: 0-No pain         Complications: No notable events documented.

## 2023-02-02 NOTE — H&P (Signed)
Outpatient short stay form Pre-procedure 02/02/2023  Brenda Bill, MD  Primary Physician: Alm Bustard, NP  Reason for visit:  Diarrhea/Change in bowel habits  History of present illness:    67 y/o lady with history of obesity, CAD, and hypertension here for colonoscopy. No blood thinners. No family history of colon cancer. History of tubal ligation. She has never had a colonoscopy before.   Current Facility-Administered Medications:    0.9 %  sodium chloride infusion, , Intravenous, Continuous, Karsyn Rochin, Rossie Muskrat, MD, Last Rate: 20 mL/hr at 02/02/23 0722, 20 mL/hr at 02/02/23 3086  Medications Prior to Admission  Medication Sig Dispense Refill Last Dose   albuterol (VENTOLIN HFA) 108 (90 Base) MCG/ACT inhaler Inhale 1-2 puffs into the lungs every 4 (four) hours as needed.   02/01/2023   alendronate (FOSAMAX) 70 MG tablet TAKE 1 TABLET BY MOUTH ONCE WEEKLY FOR OSTEOPENIA 12 tablet 4 02/01/2023   aspirin 81 MG chewable tablet Chew 1 tablet (81 mg total) by mouth daily.   Past Week   calcium carbonate (OS-CAL) 1250 (500 Ca) MG chewable tablet Chew 1 tablet by mouth daily.   Past Week   carvedilol (COREG) 6.25 MG tablet Take 1 tablet (6.25 mg total) by mouth 2 (two) times daily. 180 tablet 3 02/02/2023 at 0600   cholecalciferol (VITAMIN D) 1000 units tablet Take 1,000 Units by mouth daily.   Past Week   Cranberry 400 MG CAPS Take by mouth.   Past Week   dapagliflozin propanediol (FARXIGA) 5 MG TABS tablet TAKE 1 TAB BY MOUTH DAILY BEFORE BREAKFAST. 90 tablet 0 02/01/2023   fexofenadine (ALLEGRA) 180 MG tablet Take 180 mg by mouth daily.   02/01/2023   fluticasone (FLONASE) 50 MCG/ACT nasal spray Place 1 spray into both nostrils daily. 48 g 5 02/01/2023   ibuprofen (ADVIL,MOTRIN) 800 MG tablet Take 800 mg by mouth every 6 (six) hours as needed.    Past Week   montelukast (SINGULAIR) 10 MG tablet Take 1 tablet by mouth at bedtime.   02/01/2023   Multiple Vitamin (MULTI-VITAMINS)  TABS Take by mouth.   Past Week   Omega-3 Fatty Acids (FISH OIL) 1000 MG CPDR Take 2,000 mg by mouth daily.   Past Week   rosuvastatin (CRESTOR) 40 MG tablet TAKE 1 TABLET (40 MG TOTAL) BY MOUTH DAILY. PLEASE SCHEDULE OFFICE VISIT FOR FURTHER REFILLS. 30 tablet 0 02/01/2023   azithromycin (ZITHROMAX) 250 MG tablet Take one tab a day for 10 days for uri (Patient not taking: Reported on 05/02/2021) 10 tablet 0    benzonatate (TESSALON) 200 MG capsule Take by mouth. (Patient not taking: Reported on 05/02/2021)      chlorpheniramine-HYDROcodone (TUSSIONEX) 10-8 MG/5ML SUER Take by mouth. (Patient not taking: Reported on 05/09/2022)      doxycycline (VIBRA-TABS) 100 MG tablet Take 1 tablet (100 mg total) by mouth 2 (two) times daily. With food (Patient not taking: Reported on 05/02/2021) 14 tablet 0    nitroGLYCERIN (NITROSTAT) 0.4 MG SL tablet Place 1 tablet (0.4 mg total) under the tongue every 5 (five) minutes as needed for chest pain. 25 tablet 2    predniSONE (DELTASONE) 20 MG tablet Take 40 mg by mouth daily. (Patient not taking: Reported on 05/02/2021)        Allergies  Allergen Reactions   Sulfamethoxazole Nausea Only     Past Medical History:  Diagnosis Date   Asthma    CAD in native artery    Diabetes mellitus without complication (  HCC)    Hyperlipidemia LDL goal <70    Hypertension    ST elevation (STEMI) myocardial infarction involving left anterior descending coronary artery (HCC) 05/28/15   with DES to LAD and return to cath lab for restenosis requiring PTCA    Review of systems:  Otherwise negative.    Physical Exam  Gen: Alert, oriented. Appears stated age.  HEENT: PERRLA. Lungs: No respiratory distress CV: RRR Abd: soft, benign, no masses Ext: No edema    Planned procedures: Proceed with colonoscopy. The patient understands the nature of the planned procedure, indications, risks, alternatives and potential complications including but not limited to bleeding,  infection, perforation, damage to internal organs and possible oversedation/side effects from anesthesia. The patient agrees and gives consent to proceed.  Please refer to procedure notes for findings, recommendations and patient disposition/instructions.     Brenda Bill, MD [facility] Gastroenterology

## 2023-02-04 NOTE — Plan of Care (Signed)
[facility] Tonsillectomy/Adenoidectomy, Postoperative PEDS care plan entered in error.

## 2023-02-05 LAB — SURGICAL PATHOLOGY

## 2023-07-10 ENCOUNTER — Other Ambulatory Visit: Payer: Self-pay | Admitting: Family Medicine

## 2023-07-10 DIAGNOSIS — Z1231 Encounter for screening mammogram for malignant neoplasm of breast: Secondary | ICD-10-CM

## 2023-08-11 ENCOUNTER — Other Ambulatory Visit: Payer: Self-pay | Admitting: Cardiovascular Disease

## 2023-08-19 ENCOUNTER — Other Ambulatory Visit: Payer: Self-pay | Admitting: Cardiovascular Disease

## 2023-08-20 ENCOUNTER — Telehealth: Payer: Self-pay | Admitting: Cardiovascular Disease

## 2023-08-20 NOTE — Telephone Encounter (Signed)
-----   Message from [facility] Genevia Kern C sent at 08/20/2023  8:17 AM EDT ----- Regarding: Appt Please contact patient for a past due follow up with Dr. Gollan. The patient has a recall for 06/2023. Thanks, Genevia Kern

## 2023-08-20 NOTE — Telephone Encounter (Signed)
 Let a voice mail message for a follow up appointment.

## 2023-09-05 ENCOUNTER — Other Ambulatory Visit: Payer: Self-pay | Admitting: Medical Genetics

## 2023-09-07 ENCOUNTER — Other Ambulatory Visit
Admission: RE | Admit: 2023-09-07 | Discharge: 2023-09-07 | Disposition: A | Discharge status: Home / Self Care | Payer: Self-pay | Source: Ambulatory Visit | Attending: Medical Genetics | Admitting: Medical Genetics

## 2023-09-13 ENCOUNTER — Other Ambulatory Visit: Payer: Self-pay | Admitting: Cardiovascular Disease

## 2023-09-30 LAB — GENECONNECT MOLECULAR SCREEN

## 2023-10-01 ENCOUNTER — Telehealth: Payer: Self-pay | Admitting: Medical Genetics

## 2023-10-04 ENCOUNTER — Ambulatory Visit: Payer: Self-pay | Admitting: Medical Genetics

## 2023-10-08 ENCOUNTER — Other Ambulatory Visit: Payer: Self-pay | Admitting: Medical Genetics

## 2023-10-08 DIAGNOSIS — Z006 Encounter for examination for normal comparison and control in clinical research program: Secondary | ICD-10-CM

## 2023-10-08 NOTE — Progress Notes (Signed)
 New order for TNP

## 2023-10-08 NOTE — Telephone Encounter (Signed)
 [facility]  10/08/2023 2:46 PM  Brenda Guerrero [id number] responded to MyChart message informing her of test not performed result. Prior MyChart message Informed participant the test was not able to be completed with this sample and apologized for the inconvenience. Participant was requested to provide a new sample at one of our participating labs at no cost so that participant can continue participation and receive test results. Informed participant they do not need to be fasting and if there are other samples that need to be drawn, they can be done at the same visit. Participant has not had a blood transfusion or blood product in the last 30 days. Participant agreed to provide another sample. Participant was provided the [employer] program website to learn why this may have happened. Participant was thanked for their time and continued support of the above study.    Jordyn Pennstrom, BS [place]  [facility] Clinical Research Specialist II Direct Dial: [phone]  Fax: [phone]

## 2023-10-14 ENCOUNTER — Other Ambulatory Visit: Payer: Self-pay | Admitting: Cardiovascular Disease

## 2023-10-18 ENCOUNTER — Other Ambulatory Visit
Admission: RE | Admit: 2023-10-18 | Discharge: 2023-10-18 | Disposition: A | Discharge status: Home / Self Care | Payer: Self-pay | Source: Ambulatory Visit | Attending: Medical Genetics | Admitting: Medical Genetics

## 2023-10-18 DIAGNOSIS — Z006 Encounter for examination for normal comparison and control in clinical research program: Secondary | ICD-10-CM | POA: Insufficient documentation

## 2023-10-21 NOTE — Progress Notes (Unsigned)
 Date:  10/22/2023   ID:  Brenda Guerrero, DOB 01-Mar-1956, MRN [id number]  Patient Location:  [address] [place] [address]   Provider location:   [facility] [place] office  PCP:  Harvey Gaetana CROME, NP  Cardiologist:  None   Chief Complaint  Patient presents with   12 month follow up     Doing well.     History of Present Illness:    Brenda Guerrero is a 68 y.o. female  past medical history of CAD. Anterior STEMI 05/2015 obesity,  HTN,  HLD,  diabetes, asthma  COVID in July 2022 and December 2022 works for [employer] , sits at a computer.    Who presents for follow-up of her coronary disease, prior STEMI  Last seen by myself in clinic 3/24 Doing well, denies chest pain concerning for angina  Sugars improving On jardiance (side effects on farxiga , GI issues, irritable) off ozempic 0.25, side effects   Does not do regular exercise program  Lots of steps at work, up and down Takes care of her mother who has early dementia Father lives with her brother  Lab work reviewed A1C 7 Total chol 111, LDL 50 Tolerating Crestor  40  Works at [employer]  EKG personally reviewed by myself on todays visit EKG Interpretation Date/Time:  Monday October 22 2023 08:36:11 EDT Ventricular Rate:  63 PR Interval:  144 QRS Duration:  72 QT Interval:  404 QTC Calculation: 413 R Axis:   13  Text Interpretation: Normal sinus rhythm Normal ECG When compared with ECG of 29-May-2015 06:44, No significant change was found Confirmed by Perla Lye [phone]) on 10/22/2023 8:40:09 AM   Other past medical history reviewed  [facility] on 05/28/15 as an anterior STEMI.   cath  single vessel obstructive CAD, DES to LAD.   Pk troponin 4.  30 minutes of arrival in the ICU the patient had marked increase in chest pain and hyperacute ST elevation consistent with reocclusion.  taken back to the cath lab and found to have reocclusion of the vessel.  She  underwent repeat stenting of the LAD and PTCA for acute stent thrombosis.  EF normal by Echo   brother has an LVAD ischemic cardiomyopathy   - Left ventricle: The cavity size was normal. Wall thickness was   normal. Systolic function was normal. The estimated ejection   fraction was in the range of 55% to 60%. Wall motion was normal;   there were no regional wall motion abnormalities. Doppler   parameters are consistent with abnormal left ventricular   relaxation (grade 1 diastolic dysfunction).   - Normal LV systolic function; grade 1 diastolic dysfunction; trace   MR and TR.  Past Medical History:  Diagnosis Date   Asthma    CAD in native artery    Diabetes mellitus without complication (HCC)    Hyperlipidemia LDL goal <70    Hypertension    ST elevation (STEMI) myocardial infarction involving left anterior descending coronary artery (HCC) 05/28/15   with DES to LAD and return to cath lab for restenosis requiring PTCA   Past Surgical History:  Procedure Laterality Date   BIOPSY  02/02/2023   Procedure: BIOPSY;  Surgeon: Maryruth Ole DASEN, MD;  Location: [facility] ENDOSCOPY;  Service: Endoscopy;;   CARDIAC CATHETERIZATION N/A 05/28/2015   Procedure: Left Heart Cath and Coronary Angiography;  Surgeon: Peter M Swaziland, MD;  Location: [facility] INVASIVE CV LAB;  Service: Cardiovascular;  Laterality:  N/A;   CARDIAC CATHETERIZATION N/A 05/28/2015   Procedure: Coronary Stent Intervention;  Surgeon: Peter M Swaziland, MD;  Location: [facility] INVASIVE CV LAB;  Service: Cardiovascular;  Laterality: N/A;   CARDIAC CATHETERIZATION N/A 05/28/2015   Procedure: Coronary Stent Intervention;  Surgeon: Peter M Swaziland, MD;  Location: [facility] INVASIVE CV LAB;  Service: Cardiovascular;  Laterality: N/A;   CARDIAC CATHETERIZATION N/A 05/28/2015   Procedure: Coronary/Graft Angiography;  Surgeon: Peter M Swaziland, MD;  Location: [facility] INVASIVE CV LAB;  Service: Cardiovascular;  Laterality: N/A;   CARDIAC CATHETERIZATION N/A 05/28/2015    Procedure: Intravascular Ultrasound/IVUS;  Surgeon: Peter M Swaziland, MD;  Location: [facility] INVASIVE CV LAB;  Service: Cardiovascular;  Laterality: N/A;   COLONOSCOPY N/A 02/02/2023   Procedure: COLONOSCOPY;  Surgeon: Maryruth Ole DASEN, MD;  Location: [facility] ENDOSCOPY;  Service: Endoscopy;  Laterality: N/A;  1ST CASE, PLEASE - WORKS IN ADMITTING   TUBAL LIGATION       Current Meds  Medication Sig   albuterol (VENTOLIN HFA) 108 (90 Base) MCG/ACT inhaler Inhale 1-2 puffs into the lungs every 4 (four) hours as needed.   alendronate  (FOSAMAX ) 70 MG tablet TAKE 1 TABLET BY MOUTH ONCE WEEKLY FOR OSTEOPENIA   aspirin  81 MG chewable tablet Chew 1 tablet (81 mg total) by mouth daily.   calcium  carbonate (OS-CAL) 1250 (500 Ca) MG chewable tablet Chew 1 tablet by mouth daily.   cholecalciferol (VITAMIN D ) 1000 units tablet Take 1,000 Units by mouth daily.   Cranberry 400 MG CAPS Take by mouth.   fexofenadine (ALLEGRA) 180 MG tablet Take 180 mg by mouth daily.   fluticasone  (FLONASE ) 50 MCG/ACT nasal spray Place 1 spray into both nostrils daily.   JARDIANCE 25 MG TABS tablet Take 25 mg by mouth daily.   montelukast (SINGULAIR) 10 MG tablet Take 1 tablet by mouth at bedtime.   Multiple Vitamin (MULTI-VITAMINS) TABS Take by mouth.   nitroGLYCERIN  (NITROSTAT ) 0.4 MG SL tablet Place 1 tablet (0.4 mg total) under the tongue every 5 (five) minutes as needed for chest pain.   Omega-3 Fatty Acids (FISH OIL ) 1000 MG CPDR Take 2,000 mg by mouth daily.   [DISCONTINUED] carvedilol  (COREG ) 6.25 MG tablet TAKE 1 TABLET BY MOUTH TWICE A DAY   [DISCONTINUED] dapagliflozin  propanediol (FARXIGA ) 5 MG TABS tablet TAKE 1 TAB BY MOUTH DAILY BEFORE BREAKFAST.   [DISCONTINUED] ibuprofen  (ADVIL ,MOTRIN ) 800 MG tablet Take 800 mg by mouth every 6 (six) hours as needed.    [DISCONTINUED] rosuvastatin  (CRESTOR ) 40 MG tablet TAKE 1 TABLET (40 MG TOTAL) BY MOUTH DAILY.     Allergies:   Semaglutide and Sulfamethoxazole    Social  History   Tobacco Use   Smoking status: Former   Smokeless tobacco: Never   Tobacco comments:    about 40 years ago reported on 03/06/2019  Vaping Use   Vaping status: Never Used  Substance Use Topics   Alcohol use: No   Drug use: No     Family Hx: The patient's family history includes Heart attack (age of onset: 34) in her brother; Heart disease in her father. There is no history of Breast cancer.  ROS:   Please see the history of present illness.    Review of Systems  Constitutional: Negative.   Respiratory: Negative.    Cardiovascular: Negative.   Gastrointestinal: Negative.   Musculoskeletal: Negative.   Neurological: Negative.   Psychiatric/Behavioral: Negative.    All other systems reviewed and are negative.    Labs/Other Tests and Data Reviewed:  Recent Labs: No results found for requested labs within last 365 days.   Recent Lipid Panel Lab Results  Component Value Date/Time   CHOL 114 03/09/2021 08:16 AM   CHOL 145 03/15/2012 10:36 AM   TRIG 130 03/09/2021 08:16 AM   TRIG 209 (H) 03/15/2012 10:36 AM   HDL 29 (L) 03/09/2021 08:16 AM   HDL 23 (L) 03/15/2012 10:36 AM   CHOLHDL 6.1 12/28/2017 07:59 AM   LDLCALC 62 03/09/2021 08:16 AM   LDLCALC 80 03/15/2012 10:36 AM    Wt Readings from Last 3 Encounters:  10/22/23 176 lb 2 oz (79.9 kg)  02/02/23 171 lb 9.6 oz (77.8 kg)  07/11/22 174 lb 8 oz (79.2 kg)     Exam:    Vital Signs:  BP 130/82 (BP Location: Left Arm, Patient Position: Sitting, Cuff Size: Normal)   Pulse 63   Ht 5' 2 (1.575 m)   Wt 176 lb 2 oz (79.9 kg)   SpO2 97%   BMI 32.21 kg/m   Constitutional:  oriented to person, place, and time. No distress.  HENT:  Head: Grossly normal Eyes:  no discharge. No scleral icterus.  Neck: No JVD, no carotid bruits  Cardiovascular: Regular rate and rhythm, no murmurs appreciated Pulmonary/Chest: Clear to auscultation bilaterally, no wheezes or rails Abdominal: Soft.  no distension.  no  tenderness.  Musculoskeletal: Normal range of motion Neurological:  normal muscle tone. Coordination normal. No atrophy Skin: Skin warm and dry Psychiatric: normal affect, pleasant  ASSESSMENT & PLAN:    Atherosclerosis of native coronary artery of native heart with stable angina pectoris (HCC) Currently with no symptoms of angina. No further workup at this time. Continue current medication regimen.  Cholesterol at goal, A1c improved  Dyslipidemia Cholesterol is at goal on the current lipid regimen. No changes to the medications were made.  Essential hypertension Blood pressure is well controlled on today's visit. No changes made to the medications.  Diabetes type II A1C improved 7, on jardiance and to dietary changes Weight stable  Recovering from COVID December 2022,  recovered    Signed, Evalene Lunger, MD  10/22/2023 8:57 AM    [facility] [address], [place], [place] [address]

## 2023-10-22 ENCOUNTER — Ambulatory Visit: Attending: Cardiovascular Disease | Admitting: Cardiovascular Disease

## 2023-10-22 ENCOUNTER — Encounter: Payer: Self-pay | Admitting: Cardiovascular Disease

## 2023-10-22 VITALS — BP 130/82 | HR 63 | Ht 62.0 in | Wt 176.1 lb

## 2023-10-22 DIAGNOSIS — E785 Hyperlipidemia, unspecified: Secondary | ICD-10-CM

## 2023-10-22 DIAGNOSIS — E1165 Type 2 diabetes mellitus with hyperglycemia: Secondary | ICD-10-CM

## 2023-10-22 DIAGNOSIS — R0600 Dyspnea, unspecified: Secondary | ICD-10-CM

## 2023-10-22 DIAGNOSIS — I10 Essential (primary) hypertension: Secondary | ICD-10-CM | POA: Diagnosis not present

## 2023-10-22 DIAGNOSIS — R0609 Other forms of dyspnea: Secondary | ICD-10-CM

## 2023-10-22 DIAGNOSIS — I25118 Atherosclerotic heart disease of native coronary artery with other forms of angina pectoris: Secondary | ICD-10-CM

## 2023-10-22 DIAGNOSIS — I5032 Chronic diastolic (congestive) heart failure: Secondary | ICD-10-CM | POA: Diagnosis not present

## 2023-10-22 DIAGNOSIS — E782 Mixed hyperlipidemia: Secondary | ICD-10-CM

## 2023-10-22 MED ORDER — ROSUVASTATIN CALCIUM 40 MG PO TABS: ORAL_TABLET | ORAL | 3 refills | Status: AC

## 2023-10-22 MED ORDER — CARVEDILOL 6.25 MG PO TABS: 6.2500 mg | ORAL_TABLET | Freq: Two times a day (BID) | ORAL | 3 refills | Status: AC

## 2023-10-22 NOTE — Patient Instructions (Signed)

## 2023-10-30 LAB — GENECONNECT MOLECULAR SCREEN: Genetic Analysis Overall Interpretation: NEGATIVE

## 2024-04-29 ENCOUNTER — Ambulatory Visit
Admission: RE | Admit: 2024-04-29 | Discharge: 2024-04-29 | Disposition: A | Discharge status: Home / Self Care | Source: Ambulatory Visit | Attending: Family Medicine | Admitting: Family Medicine

## 2024-04-29 DIAGNOSIS — Z1231 Encounter for screening mammogram for malignant neoplasm of breast: Secondary | ICD-10-CM | POA: Insufficient documentation
